# Patient Record
Sex: Male | Born: 1980
Health system: Southern US, Community
[De-identification: ages and names within clinical notes are randomized; demographics above are authoritative.]

## PROBLEM LIST (undated history)

## (undated) DIAGNOSIS — F32A Depression, unspecified: Secondary | ICD-10-CM

## (undated) DIAGNOSIS — K219 Gastro-esophageal reflux disease without esophagitis: Secondary | ICD-10-CM

## (undated) DIAGNOSIS — J45909 Unspecified asthma, uncomplicated: Secondary | ICD-10-CM

## (undated) DIAGNOSIS — I10 Essential (primary) hypertension: Secondary | ICD-10-CM

## (undated) DIAGNOSIS — F319 Bipolar disorder, unspecified: Secondary | ICD-10-CM

## (undated) DIAGNOSIS — F329 Major depressive disorder, single episode, unspecified: Secondary | ICD-10-CM

## (undated) HISTORY — PX: WISDOM TOOTH EXTRACTION: SHX21

---
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
## 2005-01-02 ENCOUNTER — Emergency Department (HOSPITAL_COMMUNITY): Admission: EM | Admit: 2005-01-02 | Discharge: 2005-01-02 | Payer: Self-pay | Admitting: Emergency Medicine

## 2005-01-03 ENCOUNTER — Emergency Department (HOSPITAL_COMMUNITY): Admission: EM | Admit: 2005-01-03 | Discharge: 2005-01-03 | Payer: Self-pay | Admitting: *Deleted

## 2005-03-05 ENCOUNTER — Emergency Department (HOSPITAL_COMMUNITY): Admission: EM | Admit: 2005-03-05 | Discharge: 2005-03-06 | Payer: Self-pay | Admitting: Emergency Medicine

## 2007-07-02 ENCOUNTER — Emergency Department (HOSPITAL_COMMUNITY): Admission: EM | Admit: 2007-07-02 | Discharge: 2007-07-02 | Payer: Self-pay | Admitting: Emergency Medicine

## 2008-06-28 ENCOUNTER — Emergency Department (HOSPITAL_COMMUNITY): Admission: EM | Admit: 2008-06-28 | Discharge: 2008-06-28 | Payer: Self-pay | Admitting: *Deleted

## 2008-07-02 ENCOUNTER — Emergency Department (HOSPITAL_COMMUNITY): Admission: EM | Admit: 2008-07-02 | Discharge: 2008-07-02 | Payer: Self-pay | Admitting: Emergency Medicine

## 2008-10-15 ENCOUNTER — Emergency Department (HOSPITAL_COMMUNITY): Admission: EM | Admit: 2008-10-15 | Discharge: 2008-10-15 | Payer: Self-pay | Admitting: Emergency Medicine

## 2008-11-29 ENCOUNTER — Emergency Department (HOSPITAL_COMMUNITY): Admission: EM | Admit: 2008-11-29 | Discharge: 2008-11-29 | Payer: Self-pay | Admitting: Emergency Medicine

## 2009-04-24 ENCOUNTER — Emergency Department (HOSPITAL_COMMUNITY): Admission: EM | Admit: 2009-04-24 | Discharge: 2009-04-25 | Payer: Self-pay | Admitting: Emergency Medicine

## 2009-04-26 ENCOUNTER — Inpatient Hospital Stay (HOSPITAL_COMMUNITY): Admission: EM | Admit: 2009-04-26 | Discharge: 2009-04-30 | Payer: Self-pay | Admitting: Emergency Medicine

## 2009-04-30 LAB — CONVERTED CEMR LAB
BUN: 14 mg/dL
HCT: 44.1 %
Hemoglobin: 15 g/dL
MCHC: 34 g/dL
MCV: 89.5 fL
Potassium: 4.2 meq/L
RBC: 4.93 M/uL
RDW: 12.9 %

## 2009-05-12 ENCOUNTER — Ambulatory Visit: Payer: Self-pay | Admitting: Nurse Practitioner

## 2009-05-12 DIAGNOSIS — K219 Gastro-esophageal reflux disease without esophagitis: Secondary | ICD-10-CM | POA: Insufficient documentation

## 2009-05-12 DIAGNOSIS — F319 Bipolar disorder, unspecified: Secondary | ICD-10-CM | POA: Insufficient documentation

## 2009-05-12 DIAGNOSIS — F17200 Nicotine dependence, unspecified, uncomplicated: Secondary | ICD-10-CM

## 2009-05-12 DIAGNOSIS — M79609 Pain in unspecified limb: Secondary | ICD-10-CM

## 2009-05-22 ENCOUNTER — Ambulatory Visit: Payer: Self-pay | Admitting: Nurse Practitioner

## 2010-04-03 NOTE — Letter (Signed)
Summary: PT INFORMATION SHEET  PT INFORMATION SHEET   Imported By: Arta Bruce 07/10/2009 16:05:20  _____________________________________________________________________  External Attachment:    Type:   Image     Comment:   External Document

## 2010-04-03 NOTE — Assessment & Plan Note (Signed)
Summary: NEW - Establish Care   Vital Signs:  Patient profile:   30 year old male Height:      70.50 inches Weight:      200.1 pounds BMI:     28.41 BSA:     2.10 O2 Sat:      96 % on Room air Temp:     97.9 degrees F oral Pulse rate:   106 / minute Pulse rhythm:   regular Resp:     20 per minute BP sitting:   131 / 78  (left arm) Cuff size:   regular  Vitals Entered By: Levon Hedger (May 12, 2009 9:54 AM)  O2 Flow:  Room air  Serial Vital Signs/Assessments:  Comments: 10:21 AM P/F  300, 400, 400 By: Levon Hedger   CC: Abdominal Pain Is Patient Diabetic? No Pain Assessment Patient in pain? yes     Location: legs,back Intensity: 6  Does patient need assistance? Functional Status Self care Ambulation Normal   CC:  Abdominal Pain.  History of Present Illness:  Pt into the office to estabish care. No previous PCP. Pt was hospitalized from2/22/2011 to 04/30/2009 Pt presented to the ER on 04/25/2009 with SOB.  He had been using primatine Mist over the counter.  He has not been to a PCP so he has been out of asthma medications of a long period of time.  His daughter has asthma and he was using some of her medications for his symptoms.  When that no longer helped with his symptoms he called 911. Dx with Acute Asthma Exacerbation, Acute respiratory Failure Medications from the hospital include prednisone (which pt has finished), azithromycin (pt had finished), and tamiflu (pt has finished).  he still has the advair which he as taken daily.  PMH - Asthma since age 22  Right eye blindness - Legally.  Since age of [age] pt has had decreased vision in his right eye.  Glasses will not help vision.  He has been declared legally blind.  He can only see outlines/shadows with the right eye.  He has not been to the eye doctor for the past 5 years. He reports having  a valid driver's license  Peak flows at home - meter given to pt while he was in the hospital.  Pt has been  monitoring at home and keeping a log of Peak Flow. 2/28 - 310 2/29 - 390  Social - recent separation from long time girlfriend. he lost his home and became unemployed because he did not have a way to work.  He also did not have any support to help care for this children. Pt has custody of his 2 oldest childrent    Dyspepsia History:      He has no alarm features of dyspepsia including no history of melena, hematochezia, dysphagia, persistent vomiting, or involuntary weight loss > 5%.  There is a prior history of GERD.  The patient does not have a prior history of documented ulcer disease.  The dominant symptom is not heartburn or acid reflux.  An H-2 blocker medication is not currently being taken.  He has no history of a positive H. Pylori serology.  No previous upper endoscopy has been done.       Habits & Providers  Alcohol-Tobacco-Diet     Alcohol drinks/day: <1     Alcohol Counseling: to decrease amount and/or frequency of alcohol intake     Tobacco Status: current     Tobacco Counseling: to quit use  of tobacco products     Cigarette Packs/Day: 1.0     Year Started: age 89  Exercise-Depression-Behavior     Drug Use: past  Allergies (verified): No Known Drug Allergies  Past History:  Past Surgical History: right arm fracture at age 66 (casted - surgery needed) right hand fracture  Social History: 3 children Tobacco abuse - 1 ppd since the age of 36 ETOH - Social  Drug use  - previous marijuana and cocaine useSmoking Status:  current Drug Use:  past Packs/Day:  1.0  Review of Systems General:  Denies fever. CV:  Denies chest pain or discomfort. Resp:  Denies cough. GI:  Denies abdominal pain, nausea, and vomiting. MS:  Complains of cramps; "My legs feel heavy" no swelling or discoloration.  Physical Exam  General:  alert.   Head:  normocephalic.   Lungs:  normal breath sounds.   Heart:  normal rate and regular rhythm.   Msk:  active ROM Extremities:  no  edema Neurologic:  cane use   Impression & Recommendations:  Problem # 1:  ASTHMA (ICD-493.90) doing well pt has a peak flow meter at home and is encouraged to use it His updated medication list for this problem includes:    Advair Diskus 250-50 Mcg/dose Aepb (Fluticasone-salmeterol) ..... One inhalation two times a day for breathing    Ventolin Hfa 108 (90 Base) Mcg/act Aers (Albuterol sulfate) .Marland Kitchen... 2 inhalations every 6 hours for shortness  Orders: Peak Flow Rate (94150) Pulse Oximetry (single measurment) (94760)  Problem # 2:  TOBACCO ABUSE (ICD-305.1) advised pt to quit smoking  Problem # 3:  BIPOLAR AFFECTIVE DISORDER (ICD-296.80) no current treatment but pt is interested in seeing what resources are available Orders: Misc. Referral (Misc. Ref)  Problem # 4:  LEG PAIN, BILATERAL (ICD-729.5) advised pt to use some strengthining activities to bil lower extremities  Complete Medication List: 1)  Advair Diskus 250-50 Mcg/dose Aepb (Fluticasone-salmeterol) .... One inhalation two times a day for breathing 2)  Ventolin Hfa 108 (90 Base) Mcg/act Aers (Albuterol sulfate) .... 2 inhalations every 6 hours for shortness  Asthma Management Plan    Personal best PEF: 400 liters/minute    Predicted PEF: 632 liters/minute    Working PEF: 632 liters/minute    Plan based on PEF formula: [employer] Zone: (Range: 510 to 630) ADVAIR DISKUS 250-50 MCG/DOSE AEPB  Yellow Zone: ADVAIR DISKUS 250-50 MCG/DOSE AEPB:  2 puffs every 4 hours as needed  Red Zone: ADVAIR DISKUS 250-50 MCG/DOSE AEPB Call your physician for shortness of breath.    Patient Instructions: 1)  You can get your medications at [facility] pharmacy. 2)  You need to keep your appointment for Eligibility. You can't be seen again in this office until you get the orange card. 3)  You can see Aquilla Solian who is on site for further assessment of Bipolar.  There may be some other resources you can use to help  you cope 4)  Leg pain - try to do leg exercises to strengthen your leg muscles. 5)  May take tylenol as needed for pain 6)  Follow up in 2 months for asthma or sooner if needed Prescriptions: VENTOLIN HFA 108 (90 BASE) MCG/ACT AERS (ALBUTEROL SULFATE) 2 inhalations every 6 hours for shortness  #1 x 1   Entered and Authorized by:   Lehman Prom FNP   Signed by:   Lehman Prom FNP on 05/12/2009   Method used:   Print then Give to  Patient   RxID:   [id number] ADVAIR DISKUS 250-50 MCG/DOSE AEPB (FLUTICASONE-SALMETEROL) One inhalation two times a day for breathing  #1 x 3   Entered and Authorized by:   Lehman Prom FNP   Signed by:   Lehman Prom FNP on 05/12/2009   Method used:   Print then Give to Patient   RxID:   [id number]    CXR  Procedure date:  04/25/2009  Findings:      unremarkable  CT of Chest  Procedure date:  04/25/2009  Findings:      Scattered atelectatic changes mostly in the low upper lung bases, greater on left. Trachea and mainstem bronchi are patent, minimal peribronchial thickening Medial aspect left upper lobe hazy opacity may present changes of asthma, minimal pneumonitis type changes also no infiltrate   CXR  Procedure date:  04/25/2009  Findings:      unremarkable  CT of Chest  Procedure date:  04/25/2009  Findings:      Scattered atelectatic changes mostly in the low upper lung bases, greater on left. Trachea and mainstem bronchi are patent, minimal peribronchial thickening Medial aspect left upper lobe hazy opacity may present changes of asthma, minimal pneumonitis type changes also no infiltrate

## 2010-04-19 NOTE — Letter (Signed)
Summary: SOCIAL WORK//NO SHOW  SOCIAL WORK//NO SHOW   Imported By: Arta Bruce 04/10/2010 10:39:24  _____________________________________________________________________  External Attachment:    Type:   Image     Comment:   External Document

## 2010-05-23 LAB — BASIC METABOLIC PANEL
BUN: 14 mg/dL (ref 6–23)
CO2: 22 mEq/L (ref 19–32)
CO2: 26 mEq/L (ref 19–32)
Calcium: 8.1 mg/dL — ABNORMAL LOW (ref 8.4–10.5)
Calcium: 8.6 mg/dL (ref 8.4–10.5)
Calcium: 9.2 mg/dL (ref 8.4–10.5)
Chloride: 105 mEq/L (ref 96–112)
Chloride: 105 mEq/L (ref 96–112)
Creatinine, Ser: 0.84 mg/dL (ref 0.4–1.5)
Creatinine, Ser: 1 mg/dL (ref 0.4–1.5)
GFR calc Af Amer: >60 mL/min (ref >60)
GFR calc non Af Amer: >60 mL/min (ref >60)
GFR calc non Af Amer: >60 mL/min (ref >60)
Glucose, Bld: 155 mg/dL — ABNORMAL HIGH (ref 70–99)
Glucose, Bld: 179 mg/dL — ABNORMAL HIGH (ref 70–99)
Glucose, Bld: 203 mg/dL — ABNORMAL HIGH (ref 70–99)
Potassium: 3.8 mEq/L (ref 3.5–5.1)
Potassium: 4 mEq/L (ref 3.5–5.1)
Sodium: 135 mEq/L (ref 135–145)
Sodium: 136 mEq/L (ref 135–145)

## 2010-05-23 LAB — DIFFERENTIAL
Basophils Absolute: 0 10*3/uL (ref 0.0–0.1)
Basophils Relative: 0 % (ref 0–1)
Monocytes Relative: 3 % (ref 3–12)

## 2010-05-23 LAB — COMPREHENSIVE METABOLIC PANEL
ALT: 22 U/L (ref 0–53)
AST: 38 U/L — ABNORMAL HIGH (ref 0–37)
Albumin: 3.5 g/dL (ref 3.5–5.2)
BUN: 11 mg/dL (ref 6–23)
CO2: 26 mEq/L (ref 19–32)
Chloride: 109 mEq/L (ref 96–112)
Chloride: 110 mEq/L (ref 96–112)
Creatinine, Ser: 0.87 mg/dL (ref 0.4–1.5)
GFR calc Af Amer: >60 mL/min (ref >60)
GFR calc non Af Amer: >60 mL/min (ref >60)
Glucose, Bld: 156 mg/dL — ABNORMAL HIGH (ref 70–99)
Potassium: 4.6 mEq/L (ref 3.5–5.1)

## 2010-05-23 LAB — CBC
HCT: 43.3 % (ref 39.0–52.0)
HCT: 43.9 % (ref 39.0–52.0)
Hemoglobin: 14.7 g/dL (ref 13.0–17.0)
Hemoglobin: 14.8 g/dL (ref 13.0–17.0)
Hemoglobin: 15 g/dL (ref 13.0–17.0)
MCHC: 33.8 g/dL (ref 30.0–36.0)
MCHC: 33.8 g/dL (ref 30.0–36.0)
MCV: 89 fL (ref 78.0–100.0)
MCV: 89.5 fL (ref 78.0–100.0)
Platelets: 247 10*3/uL (ref 150–400)
Platelets: 248 10*3/uL (ref 150–400)
RBC: 4.93 MIL/uL (ref 4.22–5.81)
RDW: 12.8 % (ref 11.5–15.5)
RDW: 12.9 % (ref 11.5–15.5)
WBC: 20.6 10*3/uL — ABNORMAL HIGH (ref 4.0–10.5)
WBC: 23.5 10*3/uL — ABNORMAL HIGH (ref 4.0–10.5)

## 2010-05-23 LAB — BLOOD GAS, ARTERIAL
Drawn by: 27407
FIO2: 0.36 %
O2 Content: 4 L/min
Patient temperature: 98.6
TCO2: 20.5 mmol/L (ref 0–100)
pCO2 arterial: 33.4 mmHg — ABNORMAL LOW (ref 35.0–45.0)
pH, Arterial: 7.384 (ref 7.350–7.450)

## 2010-05-23 LAB — POCT I-STAT 3, ART BLOOD GAS (G3+)
Acid-base deficit: 1 mmol/L (ref 0.0–2.0)
O2 Saturation: 94 %
TCO2: 23 mmol/L (ref 0–100)
pCO2 arterial: 33.1 mmHg — ABNORMAL LOW (ref 35.0–45.0)
pH, Arterial: 7.436 (ref 7.350–7.450)

## 2010-05-23 LAB — MRSA PCR SCREENING: MRSA by PCR: NEGATIVE

## 2010-06-13 LAB — POCT CARDIAC MARKERS
CKMB, poc: 2.5 ng/mL (ref 1.0–8.0)
Myoglobin, poc: 60.9 ng/mL (ref 12–200)
Troponin i, poc: <0.05 ng/mL (ref 0.00–0.09)

## 2010-06-13 LAB — POCT I-STAT, CHEM 8
Hemoglobin: 15.3 g/dL (ref 13.0–17.0)
Sodium: 139 mEq/L (ref 135–145)
TCO2: 26 mmol/L (ref 0–100)

## 2010-06-13 LAB — CBC
HCT: 43 % (ref 39.0–52.0)
Hemoglobin: 14.6 g/dL (ref 13.0–17.0)
Platelets: 230 10*3/uL (ref 150–400)
RBC: 4.84 MIL/uL (ref 4.22–5.81)
RDW: 12.4 % (ref 11.5–15.5)
WBC: 8.8 10*3/uL (ref 4.0–10.5)

## 2010-06-13 LAB — DIFFERENTIAL
Basophils Absolute: 0.1 10*3/uL (ref 0.0–0.1)
Basophils Relative: 1 % (ref 0–1)
Eosinophils Relative: 3 % (ref 0–5)
Monocytes Relative: 9 % (ref 3–12)
Neutro Abs: 4.7 10*3/uL (ref 1.7–7.7)

## 2010-06-20 ENCOUNTER — Emergency Department (HOSPITAL_COMMUNITY)
Admission: EM | Admit: 2010-06-20 | Discharge: 2010-06-20 | Disposition: A | Discharge status: Home / Self Care | Payer: 59 | Attending: Emergency Medicine | Admitting: Emergency Medicine

## 2010-06-20 ENCOUNTER — Emergency Department (HOSPITAL_COMMUNITY): Payer: 59

## 2010-06-20 DIAGNOSIS — J449 Chronic obstructive pulmonary disease, unspecified: Secondary | ICD-10-CM | POA: Insufficient documentation

## 2010-06-20 DIAGNOSIS — R0602 Shortness of breath: Secondary | ICD-10-CM | POA: Insufficient documentation

## 2010-06-20 DIAGNOSIS — R509 Fever, unspecified: Secondary | ICD-10-CM | POA: Insufficient documentation

## 2010-06-20 DIAGNOSIS — R61 Generalized hyperhidrosis: Secondary | ICD-10-CM | POA: Insufficient documentation

## 2010-06-20 DIAGNOSIS — J4489 Other specified chronic obstructive pulmonary disease: Secondary | ICD-10-CM | POA: Insufficient documentation

## 2010-06-20 DIAGNOSIS — R059 Cough, unspecified: Secondary | ICD-10-CM | POA: Insufficient documentation

## 2010-06-20 DIAGNOSIS — R05 Cough: Secondary | ICD-10-CM | POA: Insufficient documentation

## 2010-06-20 DIAGNOSIS — J189 Pneumonia, unspecified organism: Secondary | ICD-10-CM | POA: Insufficient documentation

## 2010-07-20 NOTE — Discharge Summary (Signed)
[place]  Patient:    Larry Bradley, Larry Bradley                     MRN: [id number] Adm. Date:  [id number] Disc. Date: [id number] Attending:  Garnette Scheuermann Dictator:   Maryelizabeth Rowan, M.D. CC:         Primary care physician, [facility]   Discharge Summary  PRIMARY DIAGNOSIS:  Asthma exacerbation.  SECONDARY DIAGNOSIS:  Tobacco abuse.  HISTORY OF PRESENT ILLNESS AND HOSPITAL COURSE:  This is a 30 year old with long-standing history of asthma who presented to the ER with severe shortness of breath and wheezing.  Upon arrival in the ER, the patient received albuterol, Atrovent, and steroid nebulizers and was admitted to the pediatric floor.  The patients exacerbation was so severe that he required continuous albuterol nebulizers over five hours.  The patient was also given Solu-Medrol IV.  The patient slowly responded to his nebulizer therapy, and albuterol was decreased to q.2h. p.r.n.  The patient did become very agitated and required some Ambien to help sleep and was offered some Ativan p.r.n.  The patient slowly decreased his nebulizer requirements to q.4h.  CONDITION ON DISCHARGE:  Stable.  DISCHARGE MEDICATIONS:  Albuterol.  Flovent inhalers.  Prednisone taper p.o.  FOLLOW-UP:  Appointment was made with his primary care physician for three days after discharge. DD:  10/16/99 TD:  10/17/99 Job: [id number]

## 2010-08-04 ENCOUNTER — Emergency Department (HOSPITAL_COMMUNITY)
Admission: EM | Admit: 2010-08-04 | Discharge: 2010-08-05 | Disposition: A | Discharge status: Home / Self Care | Payer: 59 | Attending: Emergency Medicine | Admitting: Emergency Medicine

## 2010-08-04 ENCOUNTER — Emergency Department (HOSPITAL_COMMUNITY): Payer: 59

## 2010-08-04 DIAGNOSIS — R0682 Tachypnea, not elsewhere classified: Secondary | ICD-10-CM | POA: Insufficient documentation

## 2010-08-04 DIAGNOSIS — J45901 Unspecified asthma with (acute) exacerbation: Secondary | ICD-10-CM | POA: Insufficient documentation

## 2010-08-04 DIAGNOSIS — R0602 Shortness of breath: Secondary | ICD-10-CM | POA: Insufficient documentation

## 2010-08-04 DIAGNOSIS — R0789 Other chest pain: Secondary | ICD-10-CM | POA: Insufficient documentation

## 2010-08-04 DIAGNOSIS — R059 Cough, unspecified: Secondary | ICD-10-CM | POA: Insufficient documentation

## 2010-08-04 DIAGNOSIS — R05 Cough: Secondary | ICD-10-CM | POA: Insufficient documentation

## 2012-10-04 ENCOUNTER — Observation Stay (HOSPITAL_COMMUNITY)
Admission: EM | Admit: 2012-10-04 | Discharge: 2012-10-05 | Disposition: A | Discharge status: Home / Self Care | Payer: 59 | Attending: General Surgery | Admitting: General Surgery

## 2012-10-04 ENCOUNTER — Encounter (HOSPITAL_COMMUNITY): Payer: Self-pay | Admitting: *Deleted

## 2012-10-04 ENCOUNTER — Emergency Department (HOSPITAL_COMMUNITY): Payer: 59

## 2012-10-04 DIAGNOSIS — R079 Chest pain, unspecified: Secondary | ICD-10-CM | POA: Insufficient documentation

## 2012-10-04 DIAGNOSIS — F17200 Nicotine dependence, unspecified, uncomplicated: Secondary | ICD-10-CM | POA: Diagnosis present

## 2012-10-04 DIAGNOSIS — S22009A Unspecified fracture of unspecified thoracic vertebra, initial encounter for closed fracture: Secondary | ICD-10-CM

## 2012-10-04 DIAGNOSIS — IMO0002 Reserved for concepts with insufficient information to code with codable children: Secondary | ICD-10-CM

## 2012-10-04 DIAGNOSIS — F10920 Alcohol use, unspecified with intoxication, uncomplicated: Secondary | ICD-10-CM

## 2012-10-04 DIAGNOSIS — T07XXXA Unspecified multiple injuries, initial encounter: Secondary | ICD-10-CM | POA: Diagnosis present

## 2012-10-04 DIAGNOSIS — F1010 Alcohol abuse, uncomplicated: Secondary | ICD-10-CM | POA: Diagnosis present

## 2012-10-04 DIAGNOSIS — S21219A Laceration without foreign body of unspecified back wall of thorax without penetration into thoracic cavity, initial encounter: Secondary | ICD-10-CM

## 2012-10-04 DIAGNOSIS — T148XXA Other injury of unspecified body region, initial encounter: Principal | ICD-10-CM | POA: Insufficient documentation

## 2012-10-04 DIAGNOSIS — F1910 Other psychoactive substance abuse, uncomplicated: Secondary | ICD-10-CM | POA: Diagnosis present

## 2012-10-04 DIAGNOSIS — S0101XA Laceration without foreign body of scalp, initial encounter: Secondary | ICD-10-CM

## 2012-10-04 HISTORY — DX: Unspecified asthma, uncomplicated: J45.909

## 2012-10-04 LAB — POCT I-STAT, CHEM 8
Glucose, Bld: 130 mg/dL — ABNORMAL HIGH (ref 70–99)
HCT: 53 % — ABNORMAL HIGH (ref 39.0–52.0)
Hemoglobin: 18 g/dL — ABNORMAL HIGH (ref 13.0–17.0)
Potassium: 4.1 mEq/L (ref 3.5–5.1)
Sodium: 140 mEq/L (ref 135–145)
TCO2: 23 mmol/L (ref 0–100)

## 2012-10-04 LAB — CBC WITH DIFFERENTIAL/PLATELET
Basophils Absolute: 0.1 10*3/uL (ref 0.0–0.1)
Basophils Relative: 0 % (ref 0–1)
MCHC: 36.5 g/dL — ABNORMAL HIGH (ref 30.0–36.0)
Monocytes Absolute: 0.7 10*3/uL (ref 0.1–1.0)
Neutro Abs: 10.1 10*3/uL — ABNORMAL HIGH (ref 1.7–7.7)
Neutrophils Relative %: 73 % (ref 43–77)
RDW: 13.2 % (ref 11.5–15.5)

## 2012-10-04 LAB — RAPID URINE DRUG SCREEN, HOSP PERFORMED: Benzodiazepines: NOT DETECTED

## 2012-10-04 MED ORDER — ALBUTEROL SULFATE HFA 108 (90 BASE) MCG/ACT IN AERS
2.0000 | INHALATION_SPRAY | Freq: Four times a day (QID) | RESPIRATORY_TRACT | Status: DC | PRN
  Filled 2012-10-04: qty 6.7

## 2012-10-04 MED ORDER — SODIUM CHLORIDE 0.9 % IV BOLUS (SEPSIS)
500.0000 mL | Freq: Once | INTRAVENOUS | Status: AC
  Administered 2012-10-04: 500 mL via INTRAVENOUS

## 2012-10-04 MED ORDER — ONDANSETRON HCL 4 MG/2ML IJ SOLN
4.0000 mg | Freq: Four times a day (QID) | INTRAMUSCULAR | Status: DC | PRN
  Administered 2012-10-04: 4 mg via INTRAVENOUS
  Filled 2012-10-04: qty 2

## 2012-10-04 MED ORDER — KETOROLAC TROMETHAMINE 30 MG/ML IJ SOLN
30.0000 mg | Freq: Four times a day (QID) | INTRAMUSCULAR | Status: DC | PRN
  Administered 2012-10-04 (×2): 30 mg via INTRAVENOUS
  Filled 2012-10-04 (×2): qty 1

## 2012-10-04 MED ORDER — HYDROMORPHONE HCL PF 1 MG/ML IJ SOLN: 1.0000 mg | INTRAMUSCULAR | Status: DC | PRN

## 2012-10-04 MED ORDER — ENOXAPARIN SODIUM 40 MG/0.4ML ~~LOC~~ SOLN
40.0000 mg | SUBCUTANEOUS | Status: DC
  Administered 2012-10-05: 40 mg via SUBCUTANEOUS
  Filled 2012-10-04 (×2): qty 0.4

## 2012-10-04 MED ORDER — IOHEXOL 300 MG/ML  SOLN
100.0000 mL | Freq: Once | INTRAMUSCULAR | Status: AC | PRN
  Administered 2012-10-04: 100 mL via INTRAVENOUS

## 2012-10-04 MED ORDER — HYDROMORPHONE HCL PF 1 MG/ML IJ SOLN
1.0000 mg | INTRAMUSCULAR | Status: DC | PRN
  Administered 2012-10-04: 1 mg via INTRAVENOUS
  Filled 2012-10-04: qty 1

## 2012-10-04 MED ORDER — ONDANSETRON HCL 4 MG PO TABS
4.0000 mg | ORAL_TABLET | Freq: Four times a day (QID) | ORAL | Status: DC | PRN
  Administered 2012-10-04: 4 mg via ORAL
  Filled 2012-10-04: qty 1

## 2012-10-04 MED ORDER — OXYCODONE HCL 5 MG PO TABS: 10.0000 mg | ORAL_TABLET | ORAL | Status: DC | PRN

## 2012-10-04 MED ORDER — DEXTROSE-NACL 5-0.9 % IV SOLN
INTRAVENOUS | Status: DC
  Administered 2012-10-04: 15:00:00 via INTRAVENOUS
  Administered 2012-10-05: 20 mL/h via INTRAVENOUS

## 2012-10-04 MED ORDER — ALBUTEROL SULFATE HFA 108 (90 BASE) MCG/ACT IN AERS
2.0000 | INHALATION_SPRAY | RESPIRATORY_TRACT | Status: DC | PRN
  Administered 2012-10-04: 2 via RESPIRATORY_TRACT
  Filled 2012-10-04: qty 6.7

## 2012-10-04 MED ORDER — MORPHINE SULFATE 4 MG/ML IJ SOLN
4.0000 mg | Freq: Once | INTRAMUSCULAR | Status: AC
  Administered 2012-10-04: 4 mg via INTRAVENOUS
  Filled 2012-10-04: qty 1

## 2012-10-04 NOTE — ED Notes (Signed)
Pt. Sitting up in bed with hard neck collar off; sitting on backboard.

## 2012-10-04 NOTE — ED Notes (Addendum)
Patient's mother gave patient her albuterol inhaler. Patient unable to take a deep breath to inhale med from inhaler. Dr. Rubin Payor made aware.

## 2012-10-04 NOTE — ED Notes (Signed)
Pt. Refusing hard collar after x 2 of explaining risks and benefits; dr. Rubin Payor made aware.

## 2012-10-04 NOTE — ED Notes (Signed)
Backboard removed.  No c/o of back pain. Cervical neck pain tenderness.

## 2012-10-04 NOTE — ED Notes (Signed)
Cleaned pt's right hand off with wound cleanser, pt has abrasions to pinky and ring finger knuckle; family at bedside
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
## 2012-10-04 NOTE — ED Notes (Signed)
O2 sats down to 88 when patient sleeping. 93% while awake. Dr. Rubin Payor made aware.

## 2012-10-04 NOTE — ED Notes (Signed)
MD at bedside. 

## 2012-10-05 ENCOUNTER — Encounter (HOSPITAL_COMMUNITY): Payer: Self-pay | Admitting: General Surgery

## 2012-10-05 MED ORDER — IBUPROFEN 800 MG PO TABS
800.0000 mg | ORAL_TABLET | Freq: Three times a day (TID) | ORAL | Status: DC
  Administered 2012-10-05: 800 mg via ORAL
  Filled 2012-10-05 (×3): qty 1

## 2012-10-05 MED ORDER — IBUPROFEN 800 MG PO TABS: 800.0000 mg | ORAL_TABLET | Freq: Three times a day (TID) | ORAL | Status: DC | PRN

## 2012-10-05 MED ORDER — TRAMADOL HCL 50 MG PO TABS: 50.0000 mg | ORAL_TABLET | Freq: Three times a day (TID) | ORAL | Status: DC | PRN

## 2012-10-05 MED ORDER — TRAMADOL HCL 50 MG PO TABS
50.0000 mg | ORAL_TABLET | Freq: Four times a day (QID) | ORAL | Status: DC | PRN
  Administered 2012-10-05: 100 mg via ORAL
  Filled 2012-10-05: qty 2

## 2012-10-05 NOTE — Discharge Summary (Signed)
Okay to go home.  This patient has been seen and I agree with the findings and treatment plan.  Carman Essick O. Starasia Sinko, III, MD, FACS [phone] (pager) [phone] (direct pager) Trauma Surgeon 
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
## 2012-10-05 NOTE — Progress Notes (Signed)
This patient has been seen and I agree with the findings and treatment plan.  Iyahna Obriant O. Zola Runion, III, MD, FACS [phone] (pager) [phone] (direct pager) Trauma Surgeon  

## 2012-10-05 NOTE — Progress Notes (Signed)
Patient discharged to home accompanied by family. Discharge instructions and rx given and explained and patient stated understanding. Patients IV was removed and he left unit in a stable condition via wheelchair.

## 2012-10-06 ENCOUNTER — Telehealth (HOSPITAL_COMMUNITY): Payer: Self-pay | Admitting: Emergency Medicine

## 2012-10-06 NOTE — Telephone Encounter (Signed)
Confirmed appt with pt for 8/15.

## 2012-10-08 ENCOUNTER — Emergency Department (HOSPITAL_COMMUNITY)
Admission: EM | Admit: 2012-10-08 | Discharge: 2012-10-08 | Disposition: A | Discharge status: Home / Self Care | Payer: No Typology Code available for payment source | Attending: Emergency Medicine | Admitting: Emergency Medicine

## 2012-10-08 ENCOUNTER — Emergency Department (HOSPITAL_COMMUNITY): Payer: 59

## 2012-10-08 ENCOUNTER — Encounter (HOSPITAL_COMMUNITY): Payer: Self-pay | Admitting: Emergency Medicine

## 2012-10-08 ENCOUNTER — Emergency Department (INDEPENDENT_AMBULATORY_CARE_PROVIDER_SITE_OTHER)
Admission: EM | Admit: 2012-10-08 | Discharge: 2012-10-08 | Disposition: A | Discharge status: Home / Self Care | Payer: Self-pay | Source: Home / Self Care | Attending: Emergency Medicine | Admitting: Emergency Medicine

## 2012-10-08 DIAGNOSIS — R519 Headache, unspecified: Secondary | ICD-10-CM

## 2012-10-08 DIAGNOSIS — R51 Headache: Secondary | ICD-10-CM

## 2012-10-08 DIAGNOSIS — I10 Essential (primary) hypertension: Secondary | ICD-10-CM | POA: Insufficient documentation

## 2012-10-08 DIAGNOSIS — R42 Dizziness and giddiness: Secondary | ICD-10-CM

## 2012-10-08 DIAGNOSIS — R5381 Other malaise: Secondary | ICD-10-CM | POA: Insufficient documentation

## 2012-10-08 DIAGNOSIS — M25531 Pain in right wrist: Secondary | ICD-10-CM

## 2012-10-08 DIAGNOSIS — R110 Nausea: Secondary | ICD-10-CM | POA: Insufficient documentation

## 2012-10-08 DIAGNOSIS — J45909 Unspecified asthma, uncomplicated: Secondary | ICD-10-CM | POA: Insufficient documentation

## 2012-10-08 DIAGNOSIS — M25539 Pain in unspecified wrist: Secondary | ICD-10-CM | POA: Insufficient documentation

## 2012-10-08 HISTORY — DX: Essential (primary) hypertension: I10

## 2012-10-08 LAB — BASIC METABOLIC PANEL
BUN: 14 mg/dL (ref 6–23)
CO2: 27 mEq/L (ref 19–32)
Chloride: 100 mEq/L (ref 96–112)
Creatinine, Ser: 0.9 mg/dL (ref 0.50–1.35)

## 2012-10-08 LAB — CBC WITH DIFFERENTIAL/PLATELET
HCT: 46.6 % (ref 39.0–52.0)
Hemoglobin: 16 g/dL (ref 13.0–17.0)
Lymphocytes Relative: 32 % (ref 12–46)
MCHC: 34.3 g/dL (ref 30.0–36.0)
Monocytes Absolute: 0.7 10*3/uL (ref 0.1–1.0)
Monocytes Relative: 7 % (ref 3–12)
Neutro Abs: 5.7 10*3/uL (ref 1.7–7.7)
WBC: 9.7 10*3/uL (ref 4.0–10.5)

## 2012-10-08 MED ORDER — IOHEXOL 350 MG/ML SOLN
80.0000 mL | Freq: Once | INTRAVENOUS | Status: AC | PRN
  Administered 2012-10-08: 80 mL via INTRAVENOUS

## 2012-10-08 MED ORDER — OXYCODONE-ACETAMINOPHEN 5-325 MG PO TABS
2.0000 | ORAL_TABLET | Freq: Once | ORAL | Status: AC
  Administered 2012-10-08: 2 via ORAL
  Filled 2012-10-08: qty 2

## 2012-10-08 MED ORDER — SODIUM CHLORIDE 0.9 % IV SOLN: Freq: Once | INTRAVENOUS | Status: DC

## 2012-10-08 MED ORDER — OXYCODONE-ACETAMINOPHEN 5-325 MG PO TABS: 1.0000 | ORAL_TABLET | Freq: Four times a day (QID) | ORAL | Status: DC | PRN

## 2012-10-08 MED ORDER — ONDANSETRON HCL 4 MG/2ML IJ SOLN: 8.0000 mg | Freq: Once | INTRAMUSCULAR | Status: DC

## 2012-10-08 MED ORDER — ONDANSETRON 4 MG PO TBDP
ORAL_TABLET | ORAL | Status: AC
  Filled 2012-10-08: qty 2

## 2012-10-08 MED ORDER — ONDANSETRON 4 MG PO TBDP
8.0000 mg | ORAL_TABLET | Freq: Once | ORAL | Status: AC
  Administered 2012-10-08: 8 mg via ORAL

## 2012-10-08 MED ORDER — ONDANSETRON HCL 4 MG/2ML IJ SOLN
INTRAMUSCULAR | Status: AC
  Filled 2012-10-08: qty 4

## 2012-10-08 NOTE — ED Notes (Signed)
Pt returned from CT °

## 2012-10-08 NOTE — ED Notes (Signed)
Pt transported to CT ?
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
## 2012-10-08 NOTE — ED Notes (Signed)
See  Physician note  
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
## 2012-10-08 NOTE — ED Provider Notes (Signed)
Medical screening examination/treatment/procedure(s) were performed by non-physician practitioner and as supervising physician I was immediately available for consultation/collaboration.  Leslee Home, M.D.  Reuben Likes, MD 10/08/12 [phone]

## 2012-10-09 NOTE — ED Provider Notes (Signed)
Medical screening examination/treatment/procedure(s) were conducted as a shared visit with non-physician practitioner(s) and myself.  I personally evaluated the patient during the encounter: On my exam patient has concussive like sx and has good smile with no facial droop noted. CTA neg for dissection, and CT head neg for bleed. Safe for discharge home.  Audree Camel, MD 10/09/12 [phone]

## 2012-10-16 ENCOUNTER — Encounter (INDEPENDENT_AMBULATORY_CARE_PROVIDER_SITE_OTHER): Payer: Self-pay

## 2012-10-16 ENCOUNTER — Ambulatory Visit (INDEPENDENT_AMBULATORY_CARE_PROVIDER_SITE_OTHER): Payer: Medicaid Other | Admitting: Internal Medicine

## 2012-10-16 DIAGNOSIS — G8921 Chronic pain due to trauma: Secondary | ICD-10-CM

## 2012-10-16 NOTE — Patient Instructions (Signed)
Need to follow up with the back specialist as soon as possible Norco 5mg  1-2 po q6hrs prn pain. Follow up with [place] as needed.
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
## 2012-10-19 ENCOUNTER — Telehealth (HOSPITAL_COMMUNITY): Payer: Self-pay | Admitting: Emergency Medicine

## 2012-10-19 NOTE — Telephone Encounter (Signed)
Referred to [facility] med records and [facility] to request records.

## 2013-05-07 ENCOUNTER — Encounter (HOSPITAL_COMMUNITY): Payer: Self-pay | Admitting: Emergency Medicine

## 2013-05-07 ENCOUNTER — Emergency Department (HOSPITAL_COMMUNITY)
Admission: EM | Admit: 2013-05-07 | Discharge: 2013-05-07 | Disposition: A | Discharge status: Home / Self Care | Payer: Medicaid Other | Source: Home / Self Care

## 2013-05-07 DIAGNOSIS — J45909 Unspecified asthma, uncomplicated: Secondary | ICD-10-CM

## 2013-05-07 MED ORDER — ALBUTEROL SULFATE HFA 108 (90 BASE) MCG/ACT IN AERS: 2.0000 | INHALATION_SPRAY | RESPIRATORY_TRACT | Status: DC | PRN

## 2013-05-07 MED ORDER — ALBUTEROL SULFATE (2.5 MG/3ML) 0.083% IN NEBU: 2.5000 mg | INHALATION_SOLUTION | Freq: Four times a day (QID) | RESPIRATORY_TRACT | Status: AC | PRN

## 2013-05-07 NOTE — ED Provider Notes (Signed)
CSN: [id number]     Arrival date & time 05/07/13  0909 History   First MD Initiated Contact with Patient 05/07/13 1021     Chief Complaint  Patient presents with  . Medication Refill   (Consider location/radiation/quality/duration/timing/severity/associated sxs/prior Treatment) HPI Comments: 33 year old male with a history of asthma is here in the urgent care for a refill of his albuterol HFA and nebulizer meds. He states the physician listed on his Medicaid card is not truly which is where he used to live he states is ridiculous to have to drive all the way to [place] when he can come to the urgent care and get these medications, denies exacerbation of asthma   Past Medical History  Diagnosis Date  . Asthma   . Hypertension    History reviewed. No pertinent past surgical history. History reviewed. No pertinent family history. History  Substance Use Topics  . Smoking status: Unknown If Ever Smoked  . Smokeless tobacco: Not on file  . Alcohol Use: Yes    Review of Systems  Constitutional: Negative.   Respiratory: Positive for wheezing. Negative for cough, choking and shortness of breath.   All other systems reviewed and are negative.    Allergies  Review of patient's allergies indicates no known allergies.  Home Medications   Current Outpatient Rx  Name  Route  Sig  Dispense  Refill  . albuterol (PROVENTIL HFA;VENTOLIN HFA) 108 (90 BASE) MCG/ACT inhaler   Inhalation   Inhale 2 puffs into the lungs every 6 (six) hours as needed for wheezing.         Marland Kitchen. ibuprofen (ADVIL,MOTRIN) 800 MG tablet   Oral   Take 1 tablet (800 mg total) by mouth every 8 (eight) hours as needed (for mild to moderate pain).   40 tablet   0   . oxyCODONE-acetaminophen (PERCOCET/ROXICET) 5-325 MG per tablet   Oral   Take 1 tablet by mouth every 6 (six) hours as needed for pain.   15 tablet   0   . traMADol (ULTRAM) 50 MG tablet   Oral   Take 1-2 tablets (50-100 mg total) by mouth every 8  (eight) hours as needed (For severe pain only).   30 tablet   0    BP 134/90  Pulse 80  Temp(Src) 98.4 F (36.9 C) (Oral)  Resp 16  SpO2 96% Physical Exam  Nursing note and vitals reviewed. Constitutional: He is oriented to person, place, and time. He appears well-developed and well-nourished.  Eyes: Conjunctivae and EOM are normal.  Neck: Normal range of motion. Neck supple.  Cardiovascular: Normal rate, regular rhythm and normal heart sounds.   Pulmonary/Chest: Effort normal. He has wheezes. He has no rales.  Neurological: He is alert and oriented to person, place, and time. He exhibits normal muscle tone.  Skin: Skin is warm and dry.  Psychiatric: He has a normal mood and affect.    ED Course  Procedures (including critical care time) Labs Review Labs Reviewed - No data to display Imaging Review No results found.   MDM  No diagnosis found. Albuterol HFA 2 puffs q 4 h prn asthma Albuterol soln, .083 % for neb Have doctor changed on MCD card    Hayden Rasmussenavid Adreanne Yono, NP 05/07/13 1036

## 2013-05-07 NOTE — ED Provider Notes (Signed)
Medical screening examination/treatment/procedure(s) were performed by resident physician or non-physician practitioner and as supervising physician I was immediately available for consultation/collaboration.   Megann Easterwood DOUGLAS MD.   Almando Brawley D Toddrick Sanna, MD 05/07/13 1228 

## 2013-05-07 NOTE — ED Notes (Signed)
Requesting refill on asthma medication and nebulizer meds.   Previous rx have expired.

## 2013-05-07 NOTE — Discharge Instructions (Signed)
Asthma, Adult Asthma is a recurring condition in which the airways tighten and narrow. Asthma can make it difficult to breathe. It can cause coughing, wheezing, and shortness of breath. Asthma episodes (also called asthma attacks) range from minor to life-threatening. Asthma cannot be cured, but medicines and lifestyle changes can help control it. CAUSES Asthma is believed to be caused by inherited (genetic) and environmental factors, but its exact cause is unknown. Asthma may be triggered by allergens, lung infections, or irritants in the air. Asthma triggers are different for each person. Common triggers include:   Animal dander.  Dust mites.  Cockroaches.  Pollen from trees or grass.  Mold.  Smoke.  Air pollutants such as dust, household cleaners, hair sprays, aerosol sprays, paint fumes, strong chemicals, or strong odors.  Cold air, weather changes, and winds (which increase molds and pollens in the air).  Strong emotional expressions such as crying or laughing hard.  Stress.  Certain medicines (such as aspirin) or types of drugs (such as beta-blockers).  Sulfites in foods and drinks. Foods and drinks that may contain sulfites include dried fruit, potato chips, and sparkling grape juice.  Infections or inflammatory conditions such as the flu, a cold, or an inflammation of the nasal membranes (rhinitis).  Gastroesophageal reflux disease (GERD).  Exercise or strenuous activity. SYMPTOMS Symptoms may occur immediately after asthma is triggered or many hours later. Symptoms include:  Wheezing.  Excessive nighttime or early morning coughing.  Frequent or severe coughing with a common cold.  Chest tightness.  Shortness of breath. DIAGNOSIS  The diagnosis of asthma is made by a review of your medical history and a physical exam. Tests may also be performed. These may include:  Lung function studies. These tests show how much air you breath in and out.  Allergy  tests.  Imaging tests such as X-rays. TREATMENT  Asthma cannot be cured, but it can usually be controlled. Treatment involves identifying and avoiding your asthma triggers. It also involves medicines. There are 2 classes of medicine used for asthma treatment:   Controller medicines. These prevent asthma symptoms from occurring. They are usually taken every day.  Reliever or rescue medicines. These quickly relieve asthma symptoms. They are used as needed and provide short-term relief. Your health care provider will help you create an asthma action plan. An asthma action plan is a written plan for managing and treating your asthma attacks. It includes a list of your asthma triggers and how they may be avoided. It also includes information on when medicines should be taken and when their dosage should be changed. An action plan may also involve the use of a device called a peak flow meter. A peak flow meter measures how well the lungs are working. It helps you monitor your condition. HOME CARE INSTRUCTIONS   Take medicine as directed by your health care provider. Speak with your health care provider if you have questions about how or when to take the medicines.  Use a peak flow meter as directed by your health care provider. Record and keep track of readings.  Understand and use the action plan to help minimize or stop an asthma attack without needing to seek medical care.  Control your home environment in the following ways to help prevent asthma attacks:  Do not smoke. Avoid being exposed to secondhand smoke.  Change your heating and air conditioning filter regularly.  Limit your use of fireplaces and wood stoves.  Get rid of pests (such as roaches and   mice) and their droppings.  Throw away plants if you see mold on them.  Clean your floors and dust regularly. Use unscented cleaning products.  Try to have someone else vacuum for you regularly. Stay out of rooms while they are being  vacuumed and for a short while afterward. If you vacuum, use a dust mask from a hardware store, a double-layered or microfilter vacuum cleaner bag, or a vacuum cleaner with a HEPA filter.  Replace carpet with wood, tile, or vinyl flooring. Carpet can trap dander and dust.  Use allergy-proof pillows, mattress covers, and box spring covers.  Wash bed sheets and blankets every week in hot water and dry them in a dryer.  Use blankets that are made of polyester or cotton.  Clean bathrooms and kitchens with bleach. If possible, have someone repaint the walls in these rooms with mold-resistant paint. Keep out of the rooms that are being cleaned and painted.  Wash hands frequently. SEEK MEDICAL CARE IF:   You have wheezing, shortness of breath, or a cough even if taking medicine to prevent attacks.  The colored mucus you cough up (sputum) is thicker than usual.  Your sputum changes from clear or white to yellow, green, gray, or bloody.  You have any problems that may be related to the medicines you are taking (such as a rash, itching, swelling, or trouble breathing).  You are using a reliever medicine more than 2 3 times per week.  Your peak flow is still at 50 79% of you personal best after following your action plan for 1 hour. SEEK IMMEDIATE MEDICAL CARE IF:   You seem to be getting worse and are unresponsive to treatment during an asthma attack.  You are short of breath even at rest.  You get short of breath when doing very little physical activity.  You have difficulty eating, drinking, or talking due to asthma symptoms.  You develop chest pain.  You develop a fast heartbeat.  You have a bluish color to your lips or fingernails.  You are lightheaded, dizzy, or faint.  Your peak flow is less than 50% of your personal best.  You have a fever or persistent symptoms for more than 2 3 days.  You have a fever and symptoms suddenly get worse. MAKE SURE YOU:   Understand these  instructions.  Will watch your condition.  Will get help right away if you are not doing well or get worse. Document Released: 02/18/2005 Document Revised: 10/21/2012 Document Reviewed: 09/17/2012 [employer] Patient Information 2014 [employer].  

## 2013-05-31 ENCOUNTER — Emergency Department (HOSPITAL_COMMUNITY)
Admission: EM | Admit: 2013-05-31 | Discharge: 2013-05-31 | Discharge status: Absent without leave | Payer: Medicaid Other | Source: Home / Self Care

## 2013-09-10 ENCOUNTER — Emergency Department: Payer: Self-pay | Admitting: Emergency Medicine

## 2013-09-10 LAB — URINALYSIS, COMPLETE
BILIRUBIN, UR: NEGATIVE
BLOOD: NEGATIVE
Glucose,UR: NEGATIVE mg/dL (ref 0–75)
KETONE: NEGATIVE
LEUKOCYTE ESTERASE: NEGATIVE
Nitrite: NEGATIVE
Ph: 5 (ref 4.5–8.0)
Protein: NEGATIVE
SPECIFIC GRAVITY: 1.02 (ref 1.003–1.030)
Squamous Epithelial: NONE SEEN

## 2013-09-10 LAB — COMPREHENSIVE METABOLIC PANEL
ALBUMIN: 3.6 g/dL (ref 3.4–5.0)
ALT: 32 U/L (ref 12–78)
AST: 18 U/L (ref 15–37)
Alkaline Phosphatase: 61 U/L
Anion Gap: 8 (ref 7–16)
BILIRUBIN TOTAL: 0.2 mg/dL (ref 0.2–1.0)
BUN: 12 mg/dL (ref 7–18)
CALCIUM: 8.8 mg/dL (ref 8.5–10.1)
CO2: 27 mmol/L (ref 21–32)
CREATININE: 0.92 mg/dL (ref 0.60–1.30)
Chloride: 103 mmol/L (ref 98–107)
EGFR (Non-African Amer.): >60
Glucose: 127 mg/dL — ABNORMAL HIGH (ref 65–99)
OSMOLALITY: 277 (ref 275–301)
POTASSIUM: 3.5 mmol/L (ref 3.5–5.1)
SODIUM: 138 mmol/L (ref 136–145)
TOTAL PROTEIN: 7.1 g/dL (ref 6.4–8.2)

## 2013-09-10 LAB — CBC WITH DIFFERENTIAL/PLATELET
BASOS PCT: 1 %
Basophil #: 0.1 10*3/uL (ref 0.0–0.1)
EOS ABS: 0.4 10*3/uL (ref 0.0–0.7)
EOS PCT: 3.5 %
HCT: 48.2 % (ref 40.0–52.0)
HGB: 16.2 g/dL (ref 13.0–18.0)
LYMPHS ABS: 3.6 10*3/uL (ref 1.0–3.6)
Lymphocyte %: 36.2 %
MCH: 28.9 pg (ref 26.0–34.0)
MCHC: 33.7 g/dL (ref 32.0–36.0)
MCV: 86 fL (ref 80–100)
MONOS PCT: 7.2 %
Monocyte #: 0.7 x10 3/mm (ref 0.2–1.0)
Neutrophil #: 5.2 10*3/uL (ref 1.4–6.5)
Neutrophil %: 52.1 %
Platelet: 252 10*3/uL (ref 150–440)
RBC: 5.61 10*6/uL (ref 4.40–5.90)
RDW: 12.9 % (ref 11.5–14.5)
WBC: 10.1 10*3/uL (ref 3.8–10.6)

## 2013-09-13 LAB — BETA STREP CULTURE(ARMC)

## 2013-09-29 ENCOUNTER — Emergency Department: Payer: Self-pay | Admitting: Emergency Medicine
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
## 2014-11-30 ENCOUNTER — Encounter: Payer: Self-pay | Admitting: Podiatry

## 2014-11-30 ENCOUNTER — Ambulatory Visit (INDEPENDENT_AMBULATORY_CARE_PROVIDER_SITE_OTHER): Payer: 59

## 2014-11-30 ENCOUNTER — Ambulatory Visit (INDEPENDENT_AMBULATORY_CARE_PROVIDER_SITE_OTHER): Payer: 59 | Admitting: Podiatry

## 2014-11-30 VITALS — BP 112/73 | HR 83 | Resp 16 | Ht 72.0 in | Wt 203.0 lb

## 2014-11-30 DIAGNOSIS — Q667 Congenital pes cavus: Secondary | ICD-10-CM

## 2014-11-30 DIAGNOSIS — B079 Viral wart, unspecified: Secondary | ICD-10-CM

## 2014-11-30 DIAGNOSIS — M79673 Pain in unspecified foot: Secondary | ICD-10-CM

## 2014-11-30 DIAGNOSIS — B078 Other viral warts: Secondary | ICD-10-CM

## 2014-11-30 DIAGNOSIS — B070 Plantar wart: Secondary | ICD-10-CM

## 2014-11-30 DIAGNOSIS — M216X9 Other acquired deformities of unspecified foot: Secondary | ICD-10-CM

## 2014-11-30 MED ORDER — TRAMADOL HCL 50 MG PO TABS: 50.0000 mg | ORAL_TABLET | Freq: Three times a day (TID) | ORAL | Status: DC

## 2014-11-30 NOTE — Progress Notes (Signed)
Subjective:     Patient ID: Larry Bradley, male   DOB: 09-Jun-1980, 34 y.o.   MRN: [id number]  HPI patient presents with pain in the plantar aspect of the left foot and pain on the right big toe plantar hallux that a been present for a long time. States the left foot hurting him for 5-7 years and the right for the last several years and makes it increasingly difficult for him to walk comfortably   Review of Systems  All other systems reviewed and are negative.      Objective:   Physical Exam  Constitutional: He is oriented to person, place, and time.  Cardiovascular: Intact distal pulses.   Musculoskeletal: Normal range of motion.  Neurological: He is oriented to person, place, and time.  Skin: Skin is warm.  Nursing note and vitals reviewed.  neurovascular status intact muscle strength adequate range of motion within normal limits. Patient's noted to have a painful plantar keratotic lesion on the fourth metatarsal of the left foot and lesion on the plantar aspect right hallux. Upon debridement the left lesion shows a lucent core in the right lesion shows pinpoint bleeding and pain to lateral pressure. Patient is found to have good digital perfusion and is well oriented 3     Assessment:     Plantarflexed metatarsal left with severe keratotic lesion which is probable porokeratotic in nature and what appears to be verruca plantaris plantar aspect right hallux with him having a history of these in different areas    Plan:     H&P and x-rays reviewed with patient. Today I did deep debridement of lesions and applied a immune chemical to the right plantar hallux and acid prep to the left fourth metatarsal and discussed ultimately this may require elevating osteotomy with excision of lesion left depending on response and other treatment for the right depending on response. I educated him on this and reappoint to recheck

## 2014-11-30 NOTE — Progress Notes (Signed)
   Subjective:    Patient ID: Larry Bradley, male    DOB: 11/17/1980, 34 y.o.   MRN: [id number]  HPI Patient presents with bilateral foot pain. Pt stated, "works on feet all day and has pain every day". This has been going on for the past 7-8 years.  Patient also presents with bilateral callouses, ball of foot, big toes-medial; heel; x2-3 years.     Review of Systems  Constitutional: Positive for appetite change and fatigue.  HENT: Positive for sinus pressure and sneezing.   Eyes: Positive for itching and visual disturbance.  Respiratory: Positive for cough, chest tightness, shortness of breath and wheezing.   Cardiovascular: Positive for chest pain.  Endocrine: Positive for polydipsia.  Musculoskeletal: Positive for back pain, arthralgias and gait problem.  Neurological: Positive for numbness and headaches.  All other systems reviewed and are negative.      Objective:   Physical Exam        Assessment & Plan:

## 2014-12-28 ENCOUNTER — Ambulatory Visit: Payer: 59 | Admitting: Podiatry

## 2015-01-11 ENCOUNTER — Ambulatory Visit: Payer: 59 | Admitting: Podiatry

## 2015-01-12 ENCOUNTER — Ambulatory Visit: Payer: 59 | Admitting: Podiatry

## 2015-02-02 ENCOUNTER — Ambulatory Visit: Payer: 59 | Admitting: Podiatry

## 2015-03-07 ENCOUNTER — Telehealth: Payer: Self-pay | Admitting: *Deleted

## 2015-03-07 MED ORDER — TRAMADOL HCL 50 MG PO TABS: 50.0000 mg | ORAL_TABLET | Freq: Three times a day (TID) | ORAL | Status: AC

## 2015-03-07 NOTE — Telephone Encounter (Signed)
Pt states the medication Tramadol was not called to [facility].  I spoke with [facility] tech and she said the Tramadol orders were received.

## 2015-03-07 NOTE — Telephone Encounter (Signed)
Pt states he is scheduled for surgery 03/20/2015.  I reviewed surgery schedules and pt is only scheduled to discuss surgery.  Pt seemed confused that he wouldn't be having the surgery 03/20/2015, I explained that he would need to get a explanation of the surgery, the pre-op, and post-op care and sign consent forms.  Pt states that would be fine and he would like to let his work know how long and when he would be out.  I told him we were able in most cases to schedule the pt for a date 7-10 days after the consent.  Pt states understanding and request the Tramadol be called in to [facility] [phone].

## 2015-03-20 ENCOUNTER — Ambulatory Visit: Payer: 59 | Admitting: Podiatry

## 2015-04-18 ENCOUNTER — Emergency Department (HOSPITAL_COMMUNITY): Payer: BLUE CROSS/BLUE SHIELD

## 2015-04-18 ENCOUNTER — Observation Stay (HOSPITAL_COMMUNITY)
Admission: EM | Admit: 2015-04-18 | Discharge: 2015-04-19 | Disposition: A | Discharge status: Home / Self Care | Payer: BLUE CROSS/BLUE SHIELD | Attending: Family Medicine | Admitting: Family Medicine

## 2015-04-18 ENCOUNTER — Encounter (HOSPITAL_COMMUNITY): Payer: Self-pay | Admitting: Emergency Medicine

## 2015-04-18 DIAGNOSIS — J441 Chronic obstructive pulmonary disease with (acute) exacerbation: Secondary | ICD-10-CM | POA: Diagnosis not present

## 2015-04-18 DIAGNOSIS — E860 Dehydration: Secondary | ICD-10-CM | POA: Diagnosis not present

## 2015-04-18 DIAGNOSIS — J9601 Acute respiratory failure with hypoxia: Secondary | ICD-10-CM | POA: Diagnosis not present

## 2015-04-18 DIAGNOSIS — B349 Viral infection, unspecified: Secondary | ICD-10-CM

## 2015-04-18 DIAGNOSIS — F319 Bipolar disorder, unspecified: Secondary | ICD-10-CM | POA: Diagnosis present

## 2015-04-18 DIAGNOSIS — F17210 Nicotine dependence, cigarettes, uncomplicated: Secondary | ICD-10-CM | POA: Diagnosis not present

## 2015-04-18 DIAGNOSIS — Z23 Encounter for immunization: Secondary | ICD-10-CM | POA: Insufficient documentation

## 2015-04-18 DIAGNOSIS — I10 Essential (primary) hypertension: Secondary | ICD-10-CM | POA: Diagnosis not present

## 2015-04-18 DIAGNOSIS — E876 Hypokalemia: Secondary | ICD-10-CM | POA: Diagnosis present

## 2015-04-18 DIAGNOSIS — F17200 Nicotine dependence, unspecified, uncomplicated: Secondary | ICD-10-CM

## 2015-04-18 DIAGNOSIS — J45901 Unspecified asthma with (acute) exacerbation: Secondary | ICD-10-CM | POA: Diagnosis present

## 2015-04-18 HISTORY — DX: Bipolar disorder, unspecified: F31.9

## 2015-04-18 HISTORY — DX: Depression, unspecified: F32.A

## 2015-04-18 HISTORY — DX: Gastro-esophageal reflux disease without esophagitis: K21.9

## 2015-04-18 HISTORY — DX: Major depressive disorder, single episode, unspecified: F32.9

## 2015-04-18 LAB — CBC
HEMATOCRIT: 45.5 % (ref 39.0–52.0)
Hemoglobin: 15.1 g/dL (ref 13.0–17.0)
MCH: 28.7 pg (ref 26.0–34.0)
MCHC: 33.2 g/dL (ref 30.0–36.0)
MCV: 86.5 fL (ref 78.0–100.0)
Platelets: 221 10*3/uL (ref 150–400)
RBC: 5.26 MIL/uL (ref 4.22–5.81)
RDW: 12.9 % (ref 11.5–15.5)
WBC: 9.8 10*3/uL (ref 4.0–10.5)

## 2015-04-18 LAB — BASIC METABOLIC PANEL
ANION GAP: 13 (ref 5–15)
BUN: 11 mg/dL (ref 6–20)
CHLORIDE: 100 mmol/L — AB (ref 101–111)
CO2: 24 mmol/L (ref 22–32)
Calcium: 9 mg/dL (ref 8.9–10.3)
Creatinine, Ser: 1.1 mg/dL (ref 0.61–1.24)
Glucose, Bld: 107 mg/dL — ABNORMAL HIGH (ref 65–99)
POTASSIUM: 3.8 mmol/L (ref 3.5–5.1)
SODIUM: 137 mmol/L (ref 135–145)

## 2015-04-18 LAB — STREP PNEUMONIAE URINARY ANTIGEN: Strep Pneumo Urinary Antigen: NEGATIVE

## 2015-04-18 LAB — I-STAT TROPONIN, ED: Troponin i, poc: 0 ng/mL (ref 0.00–0.08)

## 2015-04-18 LAB — I-STAT CG4 LACTIC ACID, ED: LACTIC ACID, VENOUS: 1.42 mmol/L (ref 0.5–2.0)

## 2015-04-18 MED ORDER — PNEUMOCOCCAL VAC POLYVALENT 25 MCG/0.5ML IJ INJ
0.5000 mL | INJECTION | INTRAMUSCULAR | Status: AC
Start: 2015-04-19 — End: 2015-04-19
  Administered 2015-04-19: 0.5 mL via INTRAMUSCULAR
  Filled 2015-04-18: qty 0.5

## 2015-04-18 MED ORDER — OSELTAMIVIR PHOSPHATE 75 MG PO CAPS
75.0000 mg | ORAL_CAPSULE | Freq: Two times a day (BID) | ORAL | Status: DC
  Administered 2015-04-18 – 2015-04-19 (×3): 75 mg via ORAL
  Filled 2015-04-18 (×6): qty 1

## 2015-04-18 MED ORDER — ENOXAPARIN SODIUM 40 MG/0.4ML ~~LOC~~ SOLN
40.0000 mg | SUBCUTANEOUS | Status: DC
  Administered 2015-04-18 – 2015-04-19 (×2): 40 mg via SUBCUTANEOUS
  Filled 2015-04-18 (×2): qty 0.4

## 2015-04-18 MED ORDER — ALBUTEROL (5 MG/ML) CONTINUOUS INHALATION SOLN
15.0000 mg/h | INHALATION_SOLUTION | RESPIRATORY_TRACT | Status: DC
Start: 2015-04-18 — End: 2015-04-19
  Administered 2015-04-18: 5 mg/h via RESPIRATORY_TRACT
  Filled 2015-04-18: qty 20

## 2015-04-18 MED ORDER — PREDNISONE 20 MG PO TABS
60.0000 mg | ORAL_TABLET | Freq: Once | ORAL | Status: AC
  Administered 2015-04-18: 60 mg via ORAL
  Filled 2015-04-18: qty 3

## 2015-04-18 MED ORDER — IPRATROPIUM-ALBUTEROL 0.5-2.5 (3) MG/3ML IN SOLN
3.0000 mL | Freq: Once | RESPIRATORY_TRACT | Status: AC
  Administered 2015-04-18: 3 mL via RESPIRATORY_TRACT
  Filled 2015-04-18: qty 3

## 2015-04-18 MED ORDER — SODIUM CHLORIDE 0.9 % IV BOLUS (SEPSIS)
1000.0000 mL | Freq: Once | INTRAVENOUS | Status: AC
  Administered 2015-04-18: 1000 mL via INTRAVENOUS

## 2015-04-18 MED ORDER — DULOXETINE HCL 30 MG PO CPEP
30.0000 mg | ORAL_CAPSULE | Freq: Every day | ORAL | Status: DC
  Administered 2015-04-18 – 2015-04-19 (×2): 30 mg via ORAL
  Filled 2015-04-18 (×2): qty 1

## 2015-04-18 MED ORDER — POTASSIUM CHLORIDE IN NACL 20-0.9 MEQ/L-% IV SOLN
INTRAVENOUS | Status: DC
  Administered 2015-04-18 – 2015-04-19 (×2): via INTRAVENOUS
  Filled 2015-04-18 (×2): qty 1000

## 2015-04-18 MED ORDER — LEVOFLOXACIN IN D5W 750 MG/150ML IV SOLN
750.0000 mg | INTRAVENOUS | Status: DC
  Administered 2015-04-18 – 2015-04-19 (×2): 750 mg via INTRAVENOUS
  Filled 2015-04-18 (×2): qty 150

## 2015-04-18 MED ORDER — QUETIAPINE FUMARATE 50 MG PO TABS
25.0000 mg | ORAL_TABLET | Freq: Every day | ORAL | Status: DC
  Administered 2015-04-18 – 2015-04-19 (×2): 25 mg via ORAL
  Filled 2015-04-18 (×2): qty 1

## 2015-04-18 MED ORDER — TRAMADOL HCL 50 MG PO TABS
50.0000 mg | ORAL_TABLET | Freq: Three times a day (TID) | ORAL | Status: DC
  Administered 2015-04-18 – 2015-04-19 (×4): 50 mg via ORAL
  Filled 2015-04-18 (×4): qty 1

## 2015-04-18 MED ORDER — KETOROLAC TROMETHAMINE 15 MG/ML IJ SOLN
15.0000 mg | Freq: Three times a day (TID) | INTRAMUSCULAR | Status: DC
  Administered 2015-04-18 – 2015-04-19 (×4): 15 mg via INTRAVENOUS
  Filled 2015-04-18 (×4): qty 1

## 2015-04-18 MED ORDER — ACETAMINOPHEN 500 MG PO TABS
1000.0000 mg | ORAL_TABLET | Freq: Once | ORAL | Status: AC
  Administered 2015-04-18: 1000 mg via ORAL
  Filled 2015-04-18: qty 2

## 2015-04-18 MED ORDER — ALBUTEROL SULFATE (2.5 MG/3ML) 0.083% IN NEBU
2.5000 mg | INHALATION_SOLUTION | RESPIRATORY_TRACT | Status: DC | PRN
  Administered 2015-04-19: 2.5 mg via RESPIRATORY_TRACT
  Filled 2015-04-18: qty 3

## 2015-04-18 MED ORDER — IPRATROPIUM-ALBUTEROL 0.5-2.5 (3) MG/3ML IN SOLN
3.0000 mL | Freq: Three times a day (TID) | RESPIRATORY_TRACT | Status: DC
  Administered 2015-04-19 (×2): 3 mL via RESPIRATORY_TRACT
  Filled 2015-04-18 (×2): qty 3

## 2015-04-18 MED ORDER — IPRATROPIUM-ALBUTEROL 0.5-2.5 (3) MG/3ML IN SOLN
3.0000 mL | RESPIRATORY_TRACT | Status: DC
  Administered 2015-04-18 (×3): 3 mL via RESPIRATORY_TRACT
  Filled 2015-04-18 (×3): qty 3

## 2015-04-18 MED ORDER — POTASSIUM CHLORIDE CRYS ER 20 MEQ PO TBCR
40.0000 meq | EXTENDED_RELEASE_TABLET | Freq: Once | ORAL | Status: AC
  Administered 2015-04-18: 40 meq via ORAL
  Filled 2015-04-18: qty 2

## 2015-04-18 MED ORDER — METHYLPREDNISOLONE SODIUM SUCC 125 MG IJ SOLR
60.0000 mg | Freq: Four times a day (QID) | INTRAMUSCULAR | Status: DC
  Administered 2015-04-19 (×4): 60 mg via INTRAVENOUS
  Filled 2015-04-18 (×5): qty 2

## 2015-04-18 NOTE — ED Notes (Signed)
Patient states started having productive cough yesterday.   Patient states that he started having chest pain yesterday "an elephant felt like it was on my chest.  It feels like I'm having an asthma attack whenever I move".   Patient states used inhaler yesterday with no help.  Patient states chest pain persists.   Patient denies other symptoms.

## 2015-04-18 NOTE — H&P (Signed)
[facility] History and Physical                                                                                    Larry Bradley, is a 35 y.o. male  MRN: [id number]   DOB - [birth year]  Admit Date - 04/18/2015  Outpatient Primary MD for the patient is Arlyss Queen  Referring MD: Donnald Garre / ER  PMH: Past Medical History  Diagnosis Date  . Asthma   . Hypertension       PSH: History reviewed. No pertinent past surgical history.   CC:  Chief Complaint  Patient presents with  . Chest Pain  . Shortness of Breath     HPI: 35 year old male patient with history of bipolar disorder control with medications, asthma with likely underlying COPD in setting of ongoing tobacco abuse who began developing cough with green and yellow productive sputum as well as shortness of breath yesterday on 2/13. He had temperatures with spikes up to 102F. He's had pleuritic chest pain with coughing and breathing. He has utilized his home nebulizer of albuterol about 10-15 times without any improvement in symptoms. He is somewhat nauseous and states he has not had anything to eat or drink today because he feels so poorly. Although he has children, the children have not been sick recently with any similar symptoms. He has not had any diarrhea. Has never been intubated but has had prolonged hospitalizations related to severe asthma exacerbations.  ER Evaluation and treatment: MAXIMUM TEMPERATURE 102.4-BP 122/73, pulse 1:15 to 120, room air saturations 90-92% with subsequent application of 2 L oxygen now with sats 97-100% EKG: Sinus tachycardia ventricular rate 120s to bees per minute, QTC 442 ms, no acute ischemic changes 2 View CXR: Chronic bronchitis without any acute infiltrate DuoNeb 2 treatments Prednisone 60 mg by mouth 1 1000 mL IV normal saline  Review of Systems   In addition to the HPI above,  No Headache, changes with Vision or hearing, new weakness, tingling, numbness in any  extremity, No problems swallowing food or Liquids, indigestion/reflux No palpitations, orthopnea  No Abdominal pain, N/V; no melena or hematochezia, no dark tarry stools No dysuria, hematuria or flank pain No new skin rashes, lesions, masses or bruises, No new joints pains-aches No recent weight gain or loss No polyuria, polydypsia or polyphagia,  *A full 10 point Review of Systems was done, except as stated above, all other Review of Systems were negative.  Social History Social History  Substance Use Topics  . Smoking status: Current Every Day Smoker -- 1.00 packs/day    Types: Cigarettes  . Smokeless tobacco: Not on file  . 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 no significant family history regarding asthma or COPD   Prior to Admission medications   Medication Sig Start Date End Date Taking? Authorizing Provider  albuterol (PROVENTIL HFA;VENTOLIN HFA) 108 (90 BASE) MCG/ACT inhaler Inhale 2 puffs into the lungs  every 6 (six) hours as needed for wheezing.   Yes Historical Provider, MD  albuterol (PROVENTIL) (2.5 MG/3ML) 0.083% nebulizer solution Take 3 mLs (2.5 mg total) by nebulization every 6 (six) hours as needed for wheezing or shortness of breath. 05/07/13  Yes Hayden Rasmussen, NP  DULoxetine (CYMBALTA) 30 MG capsule Take 30 mg by mouth daily. 04/17/15  Yes Historical Provider, MD  Fluticasone Propionate, Inhal, (FLOVENT IN) Inhale into the lungs as needed. For asthma   Yes Historical Provider, MD  predniSONE (STERAPRED UNI-PAK 21 TAB) 10 MG (21) TBPK tablet Take 10 mg by mouth daily. On day 5 of therapy   Yes Historical Provider, MD  QUEtiapine (SEROQUEL) 25 MG tablet Take 25 mg by mouth daily. 04/17/15  Yes Historical Provider, MD    traMADol (ULTRAM) 50 MG tablet Take 1 tablet (50 mg total) by mouth 3 (three) times daily. 03/07/15  Yes Lenn Sink, DPM  VENTOLIN HFA 108 (90 BASE) MCG/ACT inhaler  09/15/14  Yes Historical Provider, MD    No Known Allergies  Physical Exam  Vitals  Blood pressure 112/62, pulse 117, temperature 100 F (37.8 C), temperature source Axillary, resp. rate 19, height 6' (1.829 m), weight 200 lb (90.719 kg), SpO2 97 %.   General:  In moderate respiratory distress as evidenced by ongoing increased work of breathing; patient pale in appearance  Psych:  Normal affect, Denies Suicidal or Homicidal ideations, Awake Alert, Oriented X 3. Speech and thought patterns are clear and appropriate, no apparent short term memory deficits  Neuro:   No focal neurological deficits, CN II through XII intact, Strength 5/5 all 4 extremities, Sensation intact all 4 extremities.  ENT:  Ears and Eyes appear Normal, Conjunctivae clear, PER. Moist oral mucosa without erythema or exudates.  Neck:  Supple, No lymphadenopathy appreciated  Respiratory:  Symmetrical chest wall movement, Good air movement bilaterally although somewhat decreased in the bases especially on left base-no overt wheezing but does have expiratory crackles and expiratory rhonchi, 2 L oxygen  Cardiac:  RRR with sinus tachycardia rates between 110 and 120 bpm, No Murmurs, no LE edema noted, no JVD, No carotid bruits, peripheral pulses palpable at 2+  Abdomen:  Positive bowel sounds, Soft, Non tender, Non distended,  No masses appreciated, no obvious hepatosplenomegaly  Skin:  No Cyanosis, fair Skin Turgor, No Skin Rash or Bruise.  Extremities: Symmetrical without obvious trauma or injury,  no effusions.  Data Review  CBC  Recent Labs Lab 04/18/15 0945  WBC 9.8  HGB 15.1  HCT 45.5  PLT 221  MCV 86.5  MCH 28.7  MCHC 33.2  RDW 12.9    Chemistries   Recent Labs Lab 04/18/15 0945  NA 137  K 3.8  CL 100*  CO2 24  GLUCOSE  107*  BUN 11  CREATININE 1.10  CALCIUM 9.0    estimated creatinine clearance is 104.8 mL/min (by C-G formula based on Cr of 1.1).  No results for input(s): TSH, T4TOTAL, T3FREE, THYROIDAB in the last 72 hours.  Invalid input(s): FREET3  Coagulation profile No results for input(s): INR, PROTIME in the last 168 hours.  No results for input(s): DDIMER in the last 72 hours.  Cardiac Enzymes No results for input(s): CKMB, TROPONINI, MYOGLOBIN in the last 168 hours.  Invalid input(s): CK  Invalid input(s): POCBNP  Urinalysis    Component Value Date/Time   COLORURINE Yellow 09/10/2013 1820   APPEARANCEUR Clear 09/10/2013 1820   LABSPEC 1.020 09/10/2013 1820   PHURINE 5.0  09/10/2013 1820   GLUCOSEU Negative 09/10/2013 1820   HGBUR Negative 09/10/2013 1820   BILIRUBINUR Negative 09/10/2013 1820   KETONESUR Negative 09/10/2013 1820   PROTEINUR Negative 09/10/2013 1820   NITRITE Negative 09/10/2013 1820   LEUKOCYTESUR Negative 09/10/2013 1820    Imaging results:   Dg Chest 2 View  04/18/2015  CLINICAL DATA:  Central chest tightness, shortness of breath for 2 days, hypertension, asthma, smoker EXAM: CHEST  2 VIEW COMPARISON:  10/08/2012 FINDINGS: Normal heart size, mediastinal contours, and pulmonary vascularity. Mild peribronchial thickening. No pulmonary infiltrate, pleural effusion, or pneumothorax. Bones unremarkable. IMPRESSION: Chronic bronchitic changes without acute infiltrate. Electronically Signed   By: Ulyses Southward M.D.   On: 04/18/2015 10:45     EKG: (Independently reviewed)  Sinus tachycardia ventricular rate 120s to bees per minute, QTC 442 ms, no acute ischemic changes   Assessment & Plan  Principal Problem:   Acute respiratory failure with hypoxia /acute asthma exacerbation with COPD -Has failed outpatient therapy for asthma exacerbation -Admit to floor/Obs -Given prednisone 60 mg in ER-we'll change to Solu-Medrol 60 mg IV every 6 hours beginning at  midnight -Productive green cough so begin Levaquin -Supportive care with oxygen, nebs and flutter valve -Suspect may have underlying influenza so check influenza PCR panel-droplet isolation -Empiric Tamiflu -Scheduled to go every 8 hours for pleuritic chest discomfort -Utilizing adult pneumonia admission order set: follow-up blood cultures, HIV, urinary strep and sputum culture  Active Problems:   BIPOLAR AFFECTIVE DISORDER -Continue preadmission medications    Acute hypokalemia -Likely secondary from multiple albuterol treatments at home -Replace in IV and orally    TOBACCO ABUSE -Cessation counseling    Dehydration, mild -Continue IV fluids of normal saline    DVT Prophylaxis: Lovenox  Family Communication:   Wife and mother at bedside  Code Status:  Full code  Condition:  Stable  Discharge disposition: Once medically stable anticipate discharge hopefully by next 24-48 hours but previously has required prolonged hospitalization for asthma exacerbation  Time spent in minutes : 60      Maribelle Hopple L. ANP on 04/18/2015 at 1:00 PM  You may contact me by going to [email or web address] - password [email or web address]  I am available from 7a-7p but please confirm I am on the schedule by going to Amion as above.   After 7p please contact night coverage person covering me after hours  [facility]

## 2015-04-18 NOTE — ED Notes (Signed)
PT has asthma and has had cough since yesterday with white and yellow sputum, pt has fever.  Lungs wheezing and 97%/RA. Normal mentation. Headache and chest is sore.  PT is ST 116 on monitor

## 2015-04-18 NOTE — ED Provider Notes (Signed)
CSN: [id number]     Arrival date & time 04/18/15  4098 History   First MD Initiated Contact with Patient 04/18/15 [phone]     Chief Complaint  Patient presents with  . Chest Pain  . Shortness of Breath   (Consider location/radiation/quality/duration/timing/severity/associated sxs/prior Treatment) HPI 35 y.o. male with a hx of Asthma, presents to the Emergency Department today complaining of shortness of breath, cough with productive sputum and chest pain since yesterday. Feels like an elephant on his chest. 7/10 constant. Tried 10-15 albuterol treatments since yesterday with minimal relief. Has recently been on steroids for back pain. States that he was admitted in the past for breathing difficulty. Reports subjective fevers, rhinorrhea, and headache. No Sore throat, no ear pain, no abdominal pain, no N/V/D. No other symptoms noted     Past Medical History  Diagnosis Date  . Asthma   . Hypertension    History reviewed. No pertinent past surgical history. No family history on file. Social History  Substance Use Topics  . Smoking status: Current Every Day Smoker -- 1.00 packs/day    Types: Cigarettes  . Smokeless tobacco: None  . Alcohol Use: No    Review of Systems ROS reviewed and all are negative for acute change except as noted in the HPI.  Allergies  Review of patient's allergies indicates no known allergies.  Home Medications   Prior to Admission medications   Medication Sig Start Date End Date Taking? Authorizing Provider  albuterol (PROVENTIL HFA;VENTOLIN HFA) 108 (90 BASE) MCG/ACT inhaler Inhale 2 puffs into the lungs every 6 (six) hours as needed for wheezing.   Yes Historical Provider, MD  albuterol (PROVENTIL) (2.5 MG/3ML) 0.083% nebulizer solution Take 3 mLs (2.5 mg total) by nebulization every 6 (six) hours as needed for wheezing or shortness of breath. 05/07/13  Yes Hayden Rasmussen, NP  Fluticasone Propionate, Inhal, (FLOVENT IN) Inhale into the lungs as needed. For asthma    Yes Historical Provider, MD  traMADol (ULTRAM) 50 MG tablet Take 1 tablet (50 mg total) by mouth 3 (three) times daily. 03/07/15  Yes Kirstie Peri Regal, DPM  VENTOLIN HFA 108 (90 BASE) MCG/ACT inhaler  09/15/14  Yes Historical Provider, MD   BP 102/74 mmHg  Pulse 115  Temp(Src) 100.6 F (38.1 C) (Oral)  Resp 22  Ht 6' (1.829 m)  Wt 90.719 kg  BMI 27.12 kg/m2  SpO2 95%   Physical Exam  Constitutional: He is oriented to person, place, and time. He appears well-developed and well-nourished.  HENT:  Head: Normocephalic and atraumatic.  Right Ear: Tympanic membrane, external ear and ear canal normal.  Left Ear: Tympanic membrane, external ear and ear canal normal.  Nose: Nose normal.  Mouth/Throat: Uvula is midline, oropharynx is clear and moist and mucous membranes are normal.  Eyes: EOM are normal.  Neck: Normal range of motion. Neck supple.  Cardiovascular: Normal rate, regular rhythm and normal heart sounds.   Pulmonary/Chest: Effort normal. No respiratory distress. He has wheezes in the right upper field, the right lower field, the left upper field and the left lower field. He exhibits no tenderness.  Abdominal: Soft. Normal appearance and bowel sounds are normal. There is no tenderness.  Musculoskeletal: Normal range of motion.  Neurological: He is alert and oriented to person, place, and time.  Skin: Skin is warm and dry.  Psychiatric: He has a normal mood and affect. His behavior is normal. Thought content normal.  Nursing note and vitals reviewed.   ED Course  Procedures (including critical care time) Labs Review Labs Reviewed  BASIC METABOLIC PANEL - Abnormal; Notable for the following:    Chloride 100 (*)    Glucose, Bld 107 (*)    All other components within normal limits  CULTURE, BLOOD (ROUTINE X 2)  CULTURE, BLOOD (ROUTINE X 2)  CBC  INFLUENZA PANEL BY PCR (TYPE A & B, H1N1)  I-STAT TROPOININ, ED  I-STAT CG4 LACTIC ACID, ED   Imaging Review Dg Chest 2  View  04/18/2015  CLINICAL DATA:  Central chest tightness, shortness of breath for 2 days, hypertension, asthma, smoker EXAM: CHEST  2 VIEW COMPARISON:  10/08/2012 FINDINGS: Normal heart size, mediastinal contours, and pulmonary vascularity. Mild peribronchial thickening. No pulmonary infiltrate, pleural effusion, or pneumothorax. Bones unremarkable. IMPRESSION: Chronic bronchitic changes without acute infiltrate. Electronically Signed   By: Ulyses Southward M.D.   On: 04/18/2015 10:45   I have personally reviewed and evaluated these images and lab results as part of my medical decision-making.   EKG Interpretation None      MDM  I have reviewed relevant laboratory values.I have reviewed relevant imaging studies.I personally interpreted the relevant EKG.I have reviewed the relevant previous healthcare records.I have reviewed EMS Documentation.I obtained HPI from historian. Patient discussed with supervising physician  ED Course:  Assessment: 34y M hx Asthma presents with CP/SOB since yesterday. On exam, he is febrile (102), tachycardic, normotensive. Given 2L [place] due to hypoxia and O2 sat 98%. Lungs show bilateral wheezing. Initial lactate negative. Given 1L NS bolus. Given Duoneb and Prednisone with minimal improvement of symptoms. Labs did not show any leukocytosis. Trop negative. CXR showed no acute infiltrate. Will admit to medicine due to acute asthma exacerbation due to viral syndrome and multiple failed home treatments. Patient is in no acute distress. Vital Signs are stable. Patient is able to ambulate. Patient able to tolerate PO.   Disposition/Plan:  Admit to Obs Pt acknowledges and agrees with plan  Supervising Physician Arby Barrette, MD   Final diagnoses:  Asthma exacerbation  Viral syndrome       Audry Pili, PA-C 04/18/15 1249  Arby Barrette, MD 04/19/15 [phone]

## 2015-04-19 DIAGNOSIS — J9601 Acute respiratory failure with hypoxia: Secondary | ICD-10-CM | POA: Diagnosis not present

## 2015-04-19 LAB — EXPECTORATED SPUTUM ASSESSMENT W GRAM STAIN, RFLX TO RESP C

## 2015-04-19 LAB — BASIC METABOLIC PANEL
ANION GAP: 6 (ref 5–15)
BUN: 10 mg/dL (ref 6–20)
CO2: 24 mmol/L (ref 22–32)
CREATININE: 0.91 mg/dL (ref 0.61–1.24)
Calcium: 8.8 mg/dL — ABNORMAL LOW (ref 8.9–10.3)
Chloride: 110 mmol/L (ref 101–111)
Glucose, Bld: 158 mg/dL — ABNORMAL HIGH (ref 65–99)
POTASSIUM: 5.8 mmol/L — AB (ref 3.5–5.1)
Sodium: 140 mmol/L (ref 135–145)

## 2015-04-19 LAB — CBC
HEMATOCRIT: 47.5 % (ref 39.0–52.0)
HEMOGLOBIN: 15.4 g/dL (ref 13.0–17.0)
MCH: 28.2 pg (ref 26.0–34.0)
MCHC: 32.4 g/dL (ref 30.0–36.0)
MCV: 87 fL (ref 78.0–100.0)
Platelets: 227 10*3/uL (ref 150–400)
RBC: 5.46 MIL/uL (ref 4.22–5.81)
RDW: 13 % (ref 11.5–15.5)
WBC: 9.4 10*3/uL (ref 4.0–10.5)

## 2015-04-19 LAB — EXPECTORATED SPUTUM ASSESSMENT W REFEX TO RESP CULTURE

## 2015-04-19 LAB — INFLUENZA PANEL BY PCR (TYPE A & B)
H1N1FLUPCR: NOT DETECTED
Influenza A By PCR: POSITIVE — AB
Influenza B By PCR: NEGATIVE

## 2015-04-19 LAB — HIV ANTIBODY (ROUTINE TESTING W REFLEX): HIV Screen 4th Generation wRfx: NONREACTIVE

## 2015-04-19 MED ORDER — PREDNISONE 20 MG PO TABS: 40.0000 mg | ORAL_TABLET | Freq: Every day | ORAL | Status: DC

## 2015-04-19 MED ORDER — PREDNISONE 20 MG PO TABS: 40.0000 mg | ORAL_TABLET | Freq: Every day | ORAL | Status: AC

## 2015-04-19 MED ORDER — OSELTAMIVIR PHOSPHATE 75 MG PO CAPS: 75.0000 mg | ORAL_CAPSULE | Freq: Two times a day (BID) | ORAL | Status: AC

## 2015-04-19 MED ORDER — OSELTAMIVIR PHOSPHATE 75 MG PO CAPS: 75.0000 mg | ORAL_CAPSULE | Freq: Two times a day (BID) | ORAL | Status: DC

## 2015-04-19 MED ORDER — PREDNISONE 10 MG (21) PO TBPK: 20.0000 mg | ORAL_TABLET | Freq: Every day | ORAL | Status: DC

## 2015-04-19 NOTE — Discharge Summary (Signed)
Physician Discharge Summary  Larry Bradley ZOX:[id number] DOB: 07-26-1980 DOA: 04/18/2015  PCP: Arlyss Queen  Admit date: 04/18/2015 Discharge date: 04/19/2015  Time spent: 25 minutes  Recommendations for Outpatient Follow-up:  1. Complete a burst of steroids prednisone 40 mg daily for 5 days 2. Complete 4 more days of Tamiflu  Discharge Diagnoses:  Principal Problem:   Acute respiratory failure with hypoxia (HCC) Active Problems:   BIPOLAR AFFECTIVE DISORDER   TOBACCO ABUSE   Asthma exacerbation in COPD (HCC)   Acute asthma exacerbation   Acute hypokalemia   Dehydration, mild   Acute respiratory failure with hypoxemia [facility])   Discharge Condition: Good  Diet recommendation: Heart healthy low-salt  Filed Weights   04/18/15 0923  Weight: 90.719 kg (200 lb)    History of present illness:  35 year old male history of asthma diagnosed at age 67, bipolar disease and a recent upper respiratory infection Daughter has been diagnosed with flu and had temperature spikes up to 102.0. Never been intubated but has been hospitalized in the past for asthma Patient was given 2 L nasal cannula and chest x-ray two-view showed a bronchitis without any acute infiltrate with sinus tachycardia Patient was given DuoNeb 2 and referred for admission  Patient admitted as acute respiratory failure with hypoxia and failed outpatient therapy for asthma He received 2 doses of Solu-Medrol and was found to be H influenza A+. We discontinue IV antibiotics and IV steroids and patient made a good recovery   Patient tells me he has 5 children and would like to be able to go home I've cautioned him to be careful and explained Dr. precautions to him and told him to her mask from his children  His potassium was low initially and it was replaced with IV potassium was a little elevated and he should have this checked as an outpatient   Discharge Exam: Filed Vitals:   04/19/15 0501 04/19/15 1400   BP: 121/80 121/82  Pulse: 81 115  Temp: 97.6 F (36.4 C) 98 F (36.7 C)  Resp: 20 20    General: EOMI NCAT Alert pleasant oriented Cardiovascular: S1-S2 no murmur rub or gallop Respiratory: Clinically clear no added sound  Discharge Instructions   Discharge Instructions    Diet - low sodium heart healthy    Complete by:  As directed      Discharge instructions    Complete by:  As directed   compelte both Tamiflu and prednisone Ensure that you use droplet precautions around your family and take a mask home with you     Increase activity slowly    Complete by:  As directed           Current Discharge Medication List    START taking these medications   Details  oseltamivir (TAMIFLU) 75 MG capsule Take 1 capsule (75 mg total) by mouth 2 (two) times daily. Qty: 8 capsule, Refills: 0      CONTINUE these medications which have NOT CHANGED   Details  !! albuterol (PROVENTIL HFA;VENTOLIN HFA) 108 (90 BASE) MCG/ACT inhaler Inhale 2 puffs into the lungs every 6 (six) hours as needed for wheezing.    albuterol (PROVENTIL) (2.5 MG/3ML) 0.083% nebulizer solution Take 3 mLs (2.5 mg total) by nebulization every 6 (six) hours as needed for wheezing or shortness of breath. Qty: 24 vial, Refills: 0    DULoxetine (CYMBALTA) 30 MG capsule Take 30 mg by mouth daily. Refills: 5    Fluticasone Propionate, Inhal, (FLOVENT IN) Inhale into  the lungs as needed. For asthma    QUEtiapine (SEROQUEL) 25 MG tablet Take 25 mg by mouth daily. Refills: 4    traMADol (ULTRAM) 50 MG tablet Take 1 tablet (50 mg total) by mouth 3 (three) times daily. Qty: 90 tablet, Refills: 1    !! VENTOLIN HFA 108 (90 BASE) MCG/ACT inhaler      !! - Potential duplicate medications found. Please discuss with provider.    STOP taking these medications     predniSONE (STERAPRED UNI-PAK 21 TAB) 10 MG (21) TBPK tablet        No Known Allergies    The results of significant diagnostics from this  hospitalization (including imaging, microbiology, ancillary and laboratory) are listed below for reference.    Significant Diagnostic Studies: Dg Chest 2 View  04/18/2015  CLINICAL DATA:  Central chest tightness, shortness of breath for 2 days, hypertension, asthma, smoker EXAM: CHEST  2 VIEW COMPARISON:  10/08/2012 FINDINGS: Normal heart size, mediastinal contours, and pulmonary vascularity. Mild peribronchial thickening. No pulmonary infiltrate, pleural effusion, or pneumothorax. Bones unremarkable. IMPRESSION: Chronic bronchitic changes without acute infiltrate. Electronically Signed   By: Ulyses Southward M.D.   On: 04/18/2015 10:45    Microbiology: Recent Results (from the past 240 hour(s))  Culture, sputum-assessment     Status: None   Collection Time: 04/18/15  7:19 PM  Result Value Ref Range Status   Specimen Description SPUTUM  Final   Special Requests NONE  Final   Sputum evaluation   Final    THIS SPECIMEN IS ACCEPTABLE. RESPIRATORY CULTURE REPORT TO FOLLOW.   Report Status 04/19/2015 FINAL  Final     Labs: Basic Metabolic Panel:  Recent Labs Lab 04/18/15 0945 04/19/15 0750  NA 137 140  K 3.8 5.8*  CL 100* 110  CO2 24 24  GLUCOSE 107* 158*  BUN 11 10  CREATININE 1.10 0.91  CALCIUM 9.0 8.8*   Liver Function Tests: No results for input(s): AST, ALT, ALKPHOS, BILITOT, PROT, ALBUMIN in the last 168 hours. No results for input(s): LIPASE, AMYLASE in the last 168 hours. No results for input(s): AMMONIA in the last 168 hours. CBC:  Recent Labs Lab 04/18/15 0945 04/19/15 0750  WBC 9.8 9.4  HGB 15.1 15.4  HCT 45.5 47.5  MCV 86.5 87.0  PLT 221 227   Cardiac Enzymes: No results for input(s): CKTOTAL, CKMB, CKMBINDEX, TROPONINI in the last 168 hours. BNP: BNP (last 3 results) No results for input(s): BNP in the last 8760 hours.  ProBNP (last 3 results) No results for input(s): PROBNP in the last 8760 hours.  CBG: No results for input(s): GLUCAP in the last 168  hours.     SignedRhetta Mura MD   [facility] 04/19/2015, 3:54 PM

## 2015-04-19 NOTE — Progress Notes (Signed)
Last steroid dose given prior to IV removal.  AVS given to patient. Understanding demonstrated. PNA vacc administered.  Belongings packed. Transportation arranged with family.

## 2015-04-21 LAB — CULTURE, RESPIRATORY W GRAM STAIN: Culture: NORMAL

## 2015-04-21 LAB — CULTURE, RESPIRATORY

## 2015-04-24 LAB — CULTURE, BLOOD (ROUTINE X 2)
CULTURE: NO GROWTH
Culture: NO GROWTH

## 2016-01-26 ENCOUNTER — Emergency Department: Payer: BLUE CROSS/BLUE SHIELD

## 2016-01-26 ENCOUNTER — Emergency Department
Admission: EM | Admit: 2016-01-26 | Discharge: 2016-01-27 | Disposition: A | Discharge status: Home / Self Care | Payer: BLUE CROSS/BLUE SHIELD | Attending: Emergency Medicine | Admitting: Emergency Medicine

## 2016-01-26 DIAGNOSIS — J449 Chronic obstructive pulmonary disease, unspecified: Secondary | ICD-10-CM | POA: Diagnosis not present

## 2016-01-26 DIAGNOSIS — Y929 Unspecified place or not applicable: Secondary | ICD-10-CM | POA: Diagnosis not present

## 2016-01-26 DIAGNOSIS — Y990 Civilian activity done for income or pay: Secondary | ICD-10-CM | POA: Diagnosis not present

## 2016-01-26 DIAGNOSIS — S20211A Contusion of right front wall of thorax, initial encounter: Secondary | ICD-10-CM | POA: Diagnosis not present

## 2016-01-26 DIAGNOSIS — J45909 Unspecified asthma, uncomplicated: Secondary | ICD-10-CM | POA: Diagnosis not present

## 2016-01-26 DIAGNOSIS — I10 Essential (primary) hypertension: Secondary | ICD-10-CM | POA: Insufficient documentation

## 2016-01-26 DIAGNOSIS — Y9389 Activity, other specified: Secondary | ICD-10-CM | POA: Diagnosis not present

## 2016-01-26 DIAGNOSIS — W010XXA Fall on same level from slipping, tripping and stumbling without subsequent striking against object, initial encounter: Secondary | ICD-10-CM | POA: Insufficient documentation

## 2016-01-26 DIAGNOSIS — Z79899 Other long term (current) drug therapy: Secondary | ICD-10-CM | POA: Insufficient documentation

## 2016-01-26 DIAGNOSIS — S299XXA Unspecified injury of thorax, initial encounter: Secondary | ICD-10-CM | POA: Diagnosis present

## 2016-01-26 DIAGNOSIS — F17210 Nicotine dependence, cigarettes, uncomplicated: Secondary | ICD-10-CM | POA: Diagnosis not present

## 2016-01-26 NOTE — ED Notes (Signed)
Pt c/o R rib pain r/t injury against steps that occurred Tuesday of this week.  Pt sts deep breaths are painful.  Pt reports taking ibuprofen/Alieve for pain w/ no relief.  Pt alert and oriented.  No bruising noted to rib cage.

## 2016-01-26 NOTE — ED Triage Notes (Signed)
Right rib pain since fall X 3 days ago, worse with coughing. Pain with deep breathing. Pt alert and oriented X4, active, cooperative, pt in NAD. RR even and unlabored, color WNL.

## 2016-01-27 MED ORDER — KETOROLAC TROMETHAMINE 30 MG/ML IJ SOLN
15.0000 mg | Freq: Once | INTRAMUSCULAR | Status: DC
  Filled 2016-01-27: qty 1

## 2016-01-27 MED ORDER — KETOROLAC TROMETHAMINE 30 MG/ML IJ SOLN: 15.0000 mg | Freq: Once | INTRAMUSCULAR | Status: DC

## 2016-01-27 MED ORDER — LIDOCAINE 5 % EX PTCH: 1.0000 | MEDICATED_PATCH | Freq: Two times a day (BID) | CUTANEOUS | 0 refills | Status: AC

## 2016-01-27 MED ORDER — LIDOCAINE 5 % EX PTCH
1.0000 | MEDICATED_PATCH | CUTANEOUS | Status: DC
  Administered 2016-01-27: 1 via TRANSDERMAL
  Filled 2016-01-27: qty 1

## 2016-01-27 MED ORDER — HYDROCODONE-ACETAMINOPHEN 5-325 MG PO TABS: 1.0000 | ORAL_TABLET | ORAL | 0 refills | Status: AC | PRN
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
## 2016-01-27 NOTE — Discharge Instructions (Signed)
You have been seen in the Emergency Department (ED) today for chest pain.  As we have discussed today?s test results are normal, and we believe your pain is due to pain/strain and/or inflammation of the muscles and/or cartilage of your chest wall.  We recommend you take ibuprofen 600 mg three times a day with meals for the next 5 days (unless you have been told previously not to take ibuprofen or NSAIDs in general).  You may also take Tylenol according to the label instructions.  Read through the included information for additional treatment recommendations and precautions.  Continue to take your regular medications and any new prescriptions provided according to the label instructions.  Return to the Emergency Department (ED) if you experience any further chest pain/pressure/tightness, difficulty breathing, or sudden sweating, or other symptoms that concern you.

## 2017-03-19 IMAGING — DX DG CHEST 2V
2 series · 2 of 2 positions shown · non-contrast
Comparison: 10/08/2012

CLINICAL DATA: Central chest tightness, shortness of breath for 2
days, hypertension, asthma, smoker

EXAM:
CHEST  2 VIEW

[chest pa]
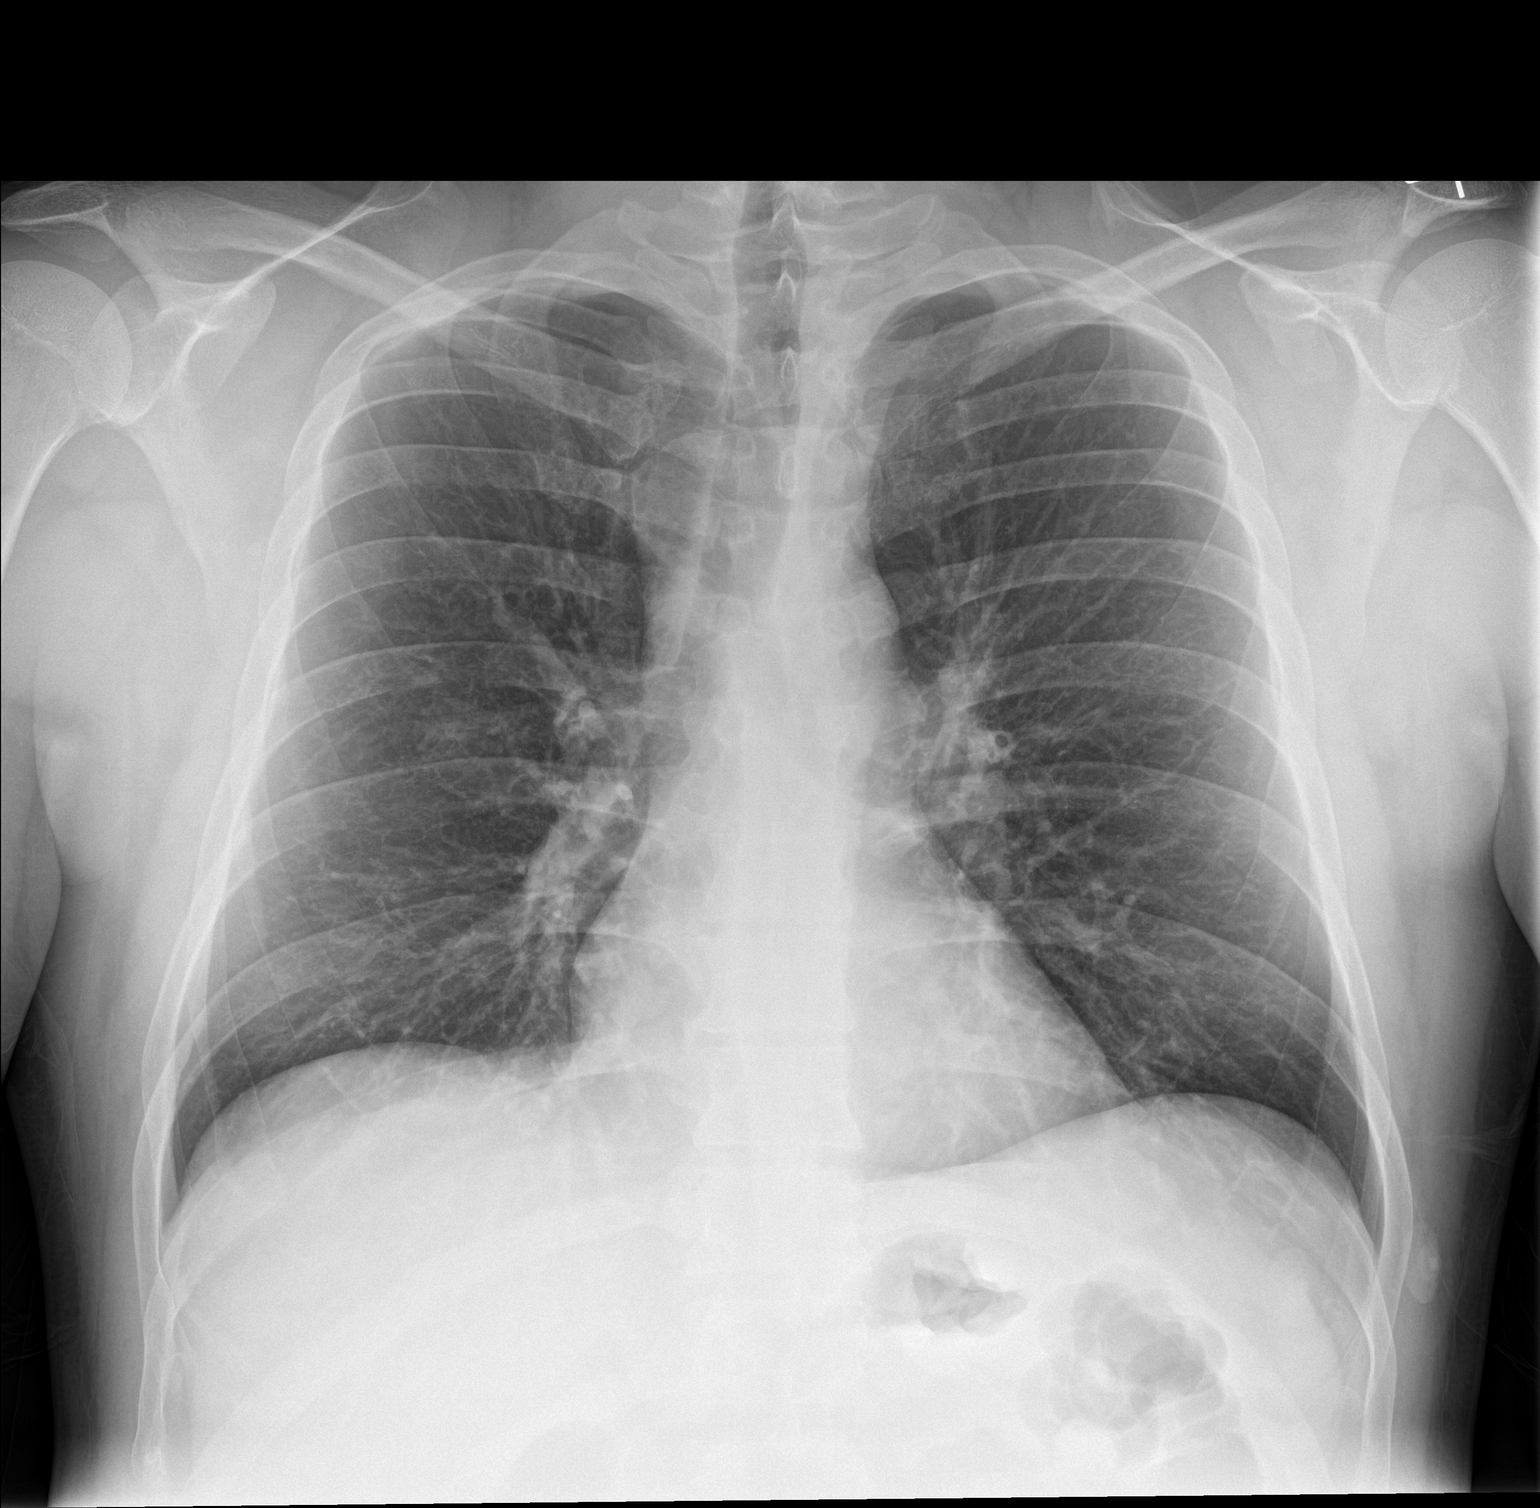

[chest lat]
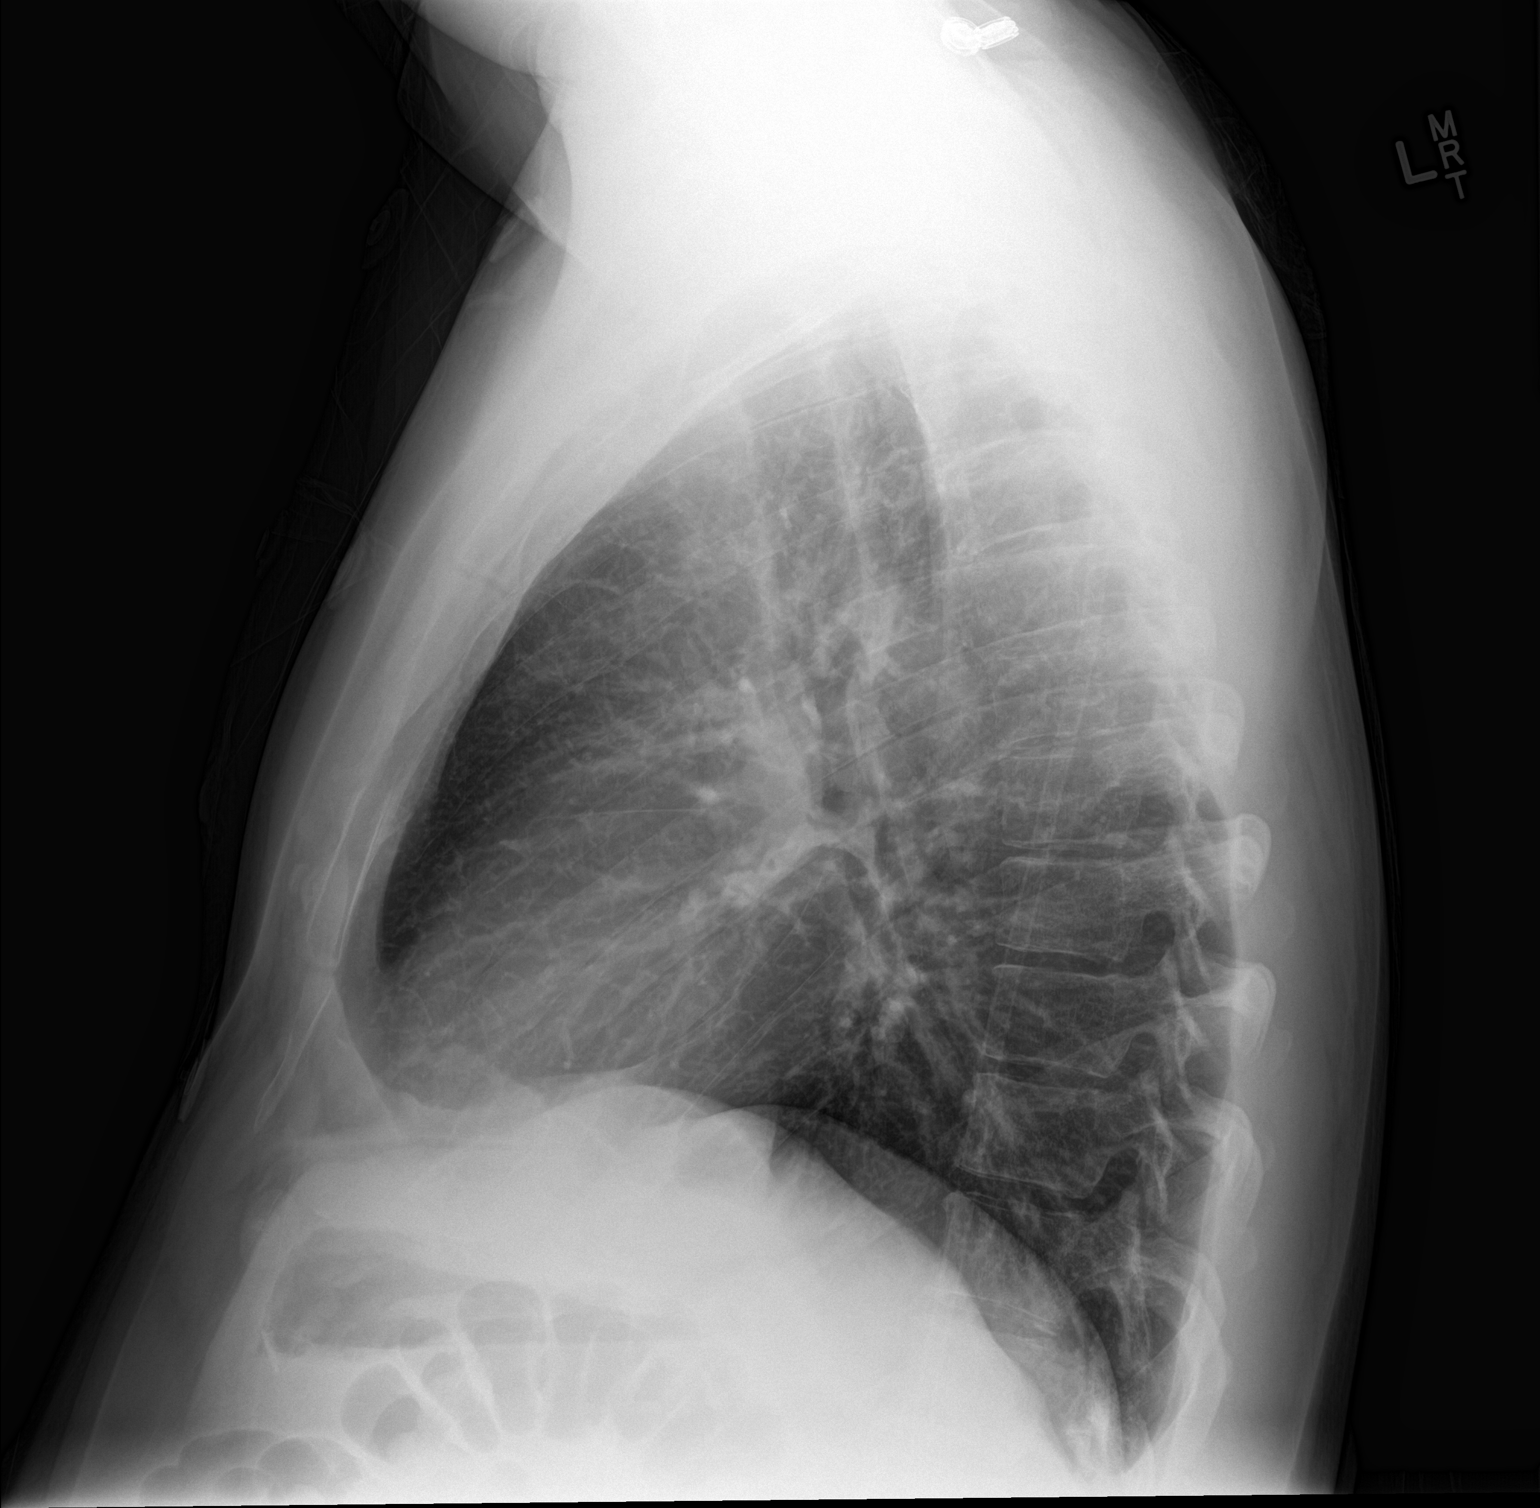

[2 of 2 positions shown; findings below may reference images not displayed]

FINDINGS: Normal heart size, mediastinal contours, and pulmonary vascularity.

Mild peribronchial thickening.

No pulmonary infiltrate, pleural effusion, or pneumothorax.

Bones unremarkable.
IMPRESSION: Chronic bronchitic changes without acute infiltrate.

## 2018-04-17 ENCOUNTER — Ambulatory Visit (HOSPITAL_COMMUNITY)
Admission: EM | Admit: 2018-04-17 | Discharge: 2018-04-17 | Disposition: A | Discharge status: Home / Self Care | Payer: BLUE CROSS/BLUE SHIELD | Attending: Family Medicine | Admitting: Family Medicine

## 2018-04-17 ENCOUNTER — Encounter (HOSPITAL_COMMUNITY): Payer: Self-pay | Admitting: Family Medicine

## 2018-04-17 DIAGNOSIS — L0291 Cutaneous abscess, unspecified: Secondary | ICD-10-CM

## 2018-04-17 MED ORDER — DOXYCYCLINE HYCLATE 100 MG PO TABS: 100.0000 mg | ORAL_TABLET | Freq: Two times a day (BID) | ORAL | 0 refills | Status: DC

## 2018-04-17 MED ORDER — DOXYCYCLINE HYCLATE 100 MG PO TABS: 100.0000 mg | ORAL_TABLET | Freq: Two times a day (BID) | ORAL | 0 refills | Status: AC
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
## 2018-04-17 NOTE — ED Triage Notes (Signed)
Pt presents with abscess on left inner thigh/groin area that is draining.

## 2018-04-17 NOTE — Discharge Instructions (Addendum)
Soak or apply warm soapy washcloth to area every couple hours while awake.
